# Patient Record
Sex: Male | Born: 1955 | Race: White | Hispanic: No | Marital: Married | Smoking: Former smoker
Health system: Southern US, Community
[De-identification: ages and names within clinical notes are randomized; demographics above are authoritative.]

## PROBLEM LIST (undated history)

## (undated) DIAGNOSIS — T8859XA Other complications of anesthesia, initial encounter: Secondary | ICD-10-CM

## (undated) DIAGNOSIS — J45909 Unspecified asthma, uncomplicated: Secondary | ICD-10-CM

## (undated) DIAGNOSIS — M1990 Unspecified osteoarthritis, unspecified site: Secondary | ICD-10-CM

## (undated) DIAGNOSIS — T4145XA Adverse effect of unspecified anesthetic, initial encounter: Secondary | ICD-10-CM

## (undated) DIAGNOSIS — C801 Malignant (primary) neoplasm, unspecified: Secondary | ICD-10-CM

## (undated) DIAGNOSIS — I619 Nontraumatic intracerebral hemorrhage, unspecified: Secondary | ICD-10-CM

## (undated) DIAGNOSIS — M4800 Spinal stenosis, site unspecified: Secondary | ICD-10-CM

## (undated) DIAGNOSIS — I219 Acute myocardial infarction, unspecified: Secondary | ICD-10-CM

## (undated) HISTORY — PX: LEG SURGERY: SHX1003

## (undated) HISTORY — PX: BACK SURGERY: SHX140

## (undated) HISTORY — PX: SPINE SURGERY: SHX786

## (undated) HISTORY — PX: FINGER SURGERY: SHX640

---

## 1998-05-15 ENCOUNTER — Emergency Department (HOSPITAL_COMMUNITY): Admission: EM | Admit: 1998-05-15 | Discharge: 1998-05-15 | Payer: Self-pay | Admitting: Emergency Medicine

## 1998-05-15 ENCOUNTER — Encounter: Payer: Self-pay | Admitting: Emergency Medicine

## 1998-05-19 ENCOUNTER — Encounter: Payer: Self-pay | Admitting: Emergency Medicine

## 1998-05-19 ENCOUNTER — Emergency Department (HOSPITAL_COMMUNITY): Admission: EM | Admit: 1998-05-19 | Discharge: 1998-05-19 | Payer: Self-pay | Admitting: Emergency Medicine

## 1998-12-16 ENCOUNTER — Emergency Department (HOSPITAL_COMMUNITY): Admission: EM | Admit: 1998-12-16 | Discharge: 1998-12-16 | Payer: Self-pay | Admitting: Emergency Medicine

## 1998-12-16 ENCOUNTER — Encounter: Payer: Self-pay | Admitting: Emergency Medicine

## 1998-12-17 ENCOUNTER — Encounter: Payer: Self-pay | Admitting: Emergency Medicine

## 1998-12-17 ENCOUNTER — Emergency Department (HOSPITAL_COMMUNITY): Admission: EM | Admit: 1998-12-17 | Discharge: 1998-12-17 | Payer: Self-pay | Admitting: Emergency Medicine

## 1998-12-18 ENCOUNTER — Emergency Department (HOSPITAL_COMMUNITY): Admission: EM | Admit: 1998-12-18 | Discharge: 1998-12-18 | Payer: Self-pay | Admitting: Emergency Medicine

## 1998-12-24 ENCOUNTER — Emergency Department (HOSPITAL_COMMUNITY): Admission: EM | Admit: 1998-12-24 | Discharge: 1998-12-24 | Payer: Self-pay | Admitting: Emergency Medicine

## 1998-12-24 ENCOUNTER — Encounter: Payer: Self-pay | Admitting: Emergency Medicine

## 2003-09-12 ENCOUNTER — Emergency Department (HOSPITAL_COMMUNITY): Admission: EM | Admit: 2003-09-12 | Discharge: 2003-09-13 | Payer: Self-pay | Admitting: Emergency Medicine

## 2004-07-05 ENCOUNTER — Emergency Department (HOSPITAL_COMMUNITY): Admission: EM | Admit: 2004-07-05 | Discharge: 2004-07-05 | Payer: Self-pay | Admitting: Emergency Medicine

## 2005-07-28 ENCOUNTER — Emergency Department (HOSPITAL_COMMUNITY): Admission: EM | Admit: 2005-07-28 | Discharge: 2005-07-28 | Payer: Self-pay | Admitting: Emergency Medicine

## 2005-08-27 ENCOUNTER — Emergency Department (HOSPITAL_COMMUNITY): Admission: EM | Admit: 2005-08-27 | Discharge: 2005-08-27 | Payer: Self-pay | Admitting: Emergency Medicine

## 2006-03-17 ENCOUNTER — Encounter: Admission: RE | Admit: 2006-03-17 | Discharge: 2006-03-17 | Payer: Self-pay | Admitting: Orthopedic Surgery

## 2006-03-21 ENCOUNTER — Ambulatory Visit (HOSPITAL_BASED_OUTPATIENT_CLINIC_OR_DEPARTMENT_OTHER): Admission: RE | Admit: 2006-03-21 | Discharge: 2006-03-21 | Payer: Self-pay | Admitting: Orthopedic Surgery

## 2006-03-21 ENCOUNTER — Encounter (INDEPENDENT_AMBULATORY_CARE_PROVIDER_SITE_OTHER): Payer: Self-pay | Admitting: Specialist

## 2008-05-28 ENCOUNTER — Emergency Department (HOSPITAL_COMMUNITY): Admission: EM | Admit: 2008-05-28 | Discharge: 2008-05-28 | Payer: Self-pay | Admitting: *Deleted

## 2009-07-05 ENCOUNTER — Emergency Department (HOSPITAL_COMMUNITY)
Admission: EM | Admit: 2009-07-05 | Discharge: 2009-07-06 | Payer: Self-pay | Source: Home / Self Care | Admitting: Emergency Medicine

## 2009-11-15 ENCOUNTER — Ambulatory Visit: Payer: Self-pay | Admitting: Cardiology

## 2009-11-15 ENCOUNTER — Observation Stay (HOSPITAL_COMMUNITY)
Admission: EM | Admit: 2009-11-15 | Discharge: 2009-11-16 | Payer: Self-pay | Source: Home / Self Care | Admitting: *Deleted

## 2009-11-19 ENCOUNTER — Telehealth (INDEPENDENT_AMBULATORY_CARE_PROVIDER_SITE_OTHER): Payer: Self-pay | Admitting: *Deleted

## 2009-11-23 ENCOUNTER — Ambulatory Visit: Payer: Self-pay | Admitting: Internal Medicine

## 2009-11-23 ENCOUNTER — Encounter: Payer: Self-pay | Admitting: Internal Medicine

## 2009-11-23 ENCOUNTER — Ambulatory Visit: Payer: Self-pay

## 2009-11-23 ENCOUNTER — Encounter (HOSPITAL_COMMUNITY)
Admission: RE | Admit: 2009-11-23 | Discharge: 2010-01-13 | Payer: Self-pay | Source: Home / Self Care | Admitting: Cardiology

## 2010-01-29 ENCOUNTER — Emergency Department (HOSPITAL_COMMUNITY)
Admission: EM | Admit: 2010-01-29 | Discharge: 2010-01-29 | Payer: Self-pay | Source: Home / Self Care | Admitting: Family Medicine

## 2010-05-20 NOTE — Assessment & Plan Note (Signed)
Summary: Cardiology Nuclear Testing  Nuclear Med Background Indications for Stress Test: Evaluation for Ischemia, Post Hospital  Indications Comments: 11/16/09 chest pain and "syncope", MI r/o  History: COPD, Myocardial Infarction  History Comments:  ~'95 MI x3 per patient (no records)  Symptoms: Chest Pressure, Chest Pressure with Exertion, Diaphoresis, DOE, Fatigue, Light-Headedness, Palpitations, Rapid HR, SOB, Syncope  Symptoms Comments: Stress and anxiety. Last episode of ZO:XWRU since d/c.   Nuclear Pre-Procedure Cardiac Risk Factors: Family History - CAD, Smoker Caffeine/Decaff Intake: None NPO After: 6:00 PM Lungs: Clear.  O2 Sat 98% on RA. IV 0.9% NS with Angio Cath: 18g     IV Site: (R) AC IV Started by: Stanton Kidney EMT-P Chest Size (in) 42     Height (in): 70.5 Weight (lb): 176 BMI: 24.99 Tech Comments: Pt. denied being on Lopressor.  Nuclear Med Study 1 or 2 day study:  1 day     Stress Test Type:  Stress Reading MD:  Dietrich Pates, MD     Referring MD:  Olga Millers, MD Resting Radionuclide:  Technetium 54m Tetrofosmin     Resting Radionuclide Dose:  10.6 mCi  Stress Radionuclide:  Technetium 1m Tetrofosmin     Stress Radionuclide Dose:  33.0 mCi   Stress Protocol Exercise Time (min):  10:16 min     Max HR:  146 bpm     Predicted Max HR:  167 bpm  Max Systolic BP: 157 mm Hg     Percent Max HR:  87.43 %     METS: 11.9 Rate Pressure Product:  04540    Stress Test Technologist:  Rea College CMA-N     Nuclear Technologist:  Burna Mortimer Deal RT-N  Rest Procedure  Myocardial perfusion imaging was performed at rest 45 minutes following the intravenous administration of Myoview Technetium 10m Tetrofosmin.  Stress Procedure  The patient exercised for 10:16.  The patient stopped due to fatigue and denied any chest pain.  There were no significant ST-T wave changes, only rare PAC's.  O2 Sats remained 98% throughout exercise.  Myoview was injected at peak exercise and  myocardial perfusion imaging was performed after a brief delay.  QPS Raw Data Images:  Images were motion corrected.  SOFt tissue (diaphragm, bowel activity) underlie heart. Stress Images:  Normal perfusion with minimal apical thinning. Rest Images:  NO significant change from the stress images. Subtraction (SDS):  No evidence of ischemia. Transient Ischemic Dilatation:  .96  (Normal <1.22)  Lung/Heart Ratio:  .34  (Normal <0.45)  Quantitative Gated Spect Images QGS EDV:  122 ml QGS ESV:  51 ml QGS EF:  58 %   Overall Impression  Exercise Capacity: Excellent exercise capacity. BP Response: Normal blood pressure response. Clinical Symptoms: No chest pain ECG Impression: No significant ST segment change suggestive of ischemia. Overall Impression: Normal stress nuclear study.  Appended Document: Cardiology Nuclear Testing ok  Appended Document: Cardiology Nuclear Testing Left message to call back    Appended Document: Cardiology Nuclear Testing pt aware of results

## 2010-05-20 NOTE — Progress Notes (Signed)
Summary: Nuclear Pre-Procedure  Phone Note Outgoing Call   Call placed by: Milana Na, EMT-P,  November 19, 2009 2:50 PM Summary of Call: Reviewed information on Myoview Information Sheet (see scanned document for further details).  Spoke with patient.     Nuclear Med Background Indications for Stress Test: Evaluation for Ischemia, Post Hospital  Indications Comments: CP R/O MI  History: COPD, Myocardial Infarction  History Comments: MI x3  Symptoms: Chest Pain, Chest Tightness, Diaphoresis, DOE, Light-Headedness    Nuclear Pre-Procedure Cardiac Risk Factors: Smoker  Nuclear Med Study Referring MD:  B.Crenshaw

## 2010-06-23 ENCOUNTER — Telehealth (INDEPENDENT_AMBULATORY_CARE_PROVIDER_SITE_OTHER): Payer: Self-pay | Admitting: *Deleted

## 2010-06-29 NOTE — Progress Notes (Signed)
Summary: Records Request  Faxed Stress to [facility] at [facility] HIM Department ([phone]). Debby Freiberg  June 23, 2010 11:31 AM

## 2010-07-01 LAB — WOUND CULTURE: Gram Stain: NONE SEEN

## 2010-07-02 LAB — CARDIAC PANEL(CRET KIN+CKTOT+MB+TROPI)
CK, MB: 6.9 ng/mL (ref 0.3–4.0)
Relative Index: 1.9 (ref 0.0–2.5)
Troponin I: 0.01 ng/mL (ref 0.00–0.06)
Troponin I: <0.01 ng/mL (ref 0.00–0.06)

## 2010-07-03 LAB — URINALYSIS, ROUTINE W REFLEX MICROSCOPIC
Nitrite: NEGATIVE
Protein, ur: NEGATIVE mg/dL
Specific Gravity, Urine: 1.023 (ref 1.005–1.030)
Urobilinogen, UA: 1 mg/dL (ref 0.0–1.0)

## 2010-07-03 LAB — POCT CARDIAC MARKERS
CKMB, poc: 3.8 ng/mL (ref 1.0–8.0)
Myoglobin, poc: 155 ng/mL (ref 12–200)
Troponin i, poc: <0.05 ng/mL (ref 0.00–0.09)

## 2010-07-03 LAB — COMPREHENSIVE METABOLIC PANEL
ALT: 23 U/L (ref 0–53)
AST: 36 U/L (ref 0–37)
Albumin: 3.8 g/dL (ref 3.5–5.2)
Alkaline Phosphatase: 38 U/L — ABNORMAL LOW (ref 39–117)
BUN: 8 mg/dL (ref 6–23)
Chloride: 110 mEq/L (ref 96–112)
Creatinine, Ser: 1.02 mg/dL (ref 0.4–1.5)
Potassium: 3.4 mEq/L — ABNORMAL LOW (ref 3.5–5.1)
Total Protein: 6.7 g/dL (ref 6.0–8.3)

## 2010-07-03 LAB — CK TOTAL AND CKMB (NOT AT ARMC)
CK, MB: 8 ng/mL (ref 0.3–4.0)
Relative Index: 1.9 (ref 0.0–2.5)
Relative Index: 1.9 (ref 0.0–2.5)
Total CK: 426 U/L — ABNORMAL HIGH (ref 7–232)
Total CK: 504 U/L — ABNORMAL HIGH (ref 7–232)

## 2010-07-03 LAB — LIPASE, BLOOD: Lipase: 18 U/L (ref 11–59)

## 2010-07-03 LAB — CBC
MCH: 32 pg (ref 26.0–34.0)
MCV: 92.3 fL (ref 78.0–100.0)
Platelets: 237 10*3/uL (ref 150–400)
RDW: 13.8 % (ref 11.5–15.5)
WBC: 7.4 10*3/uL (ref 4.0–10.5)

## 2010-07-03 LAB — RAPID URINE DRUG SCREEN, HOSP PERFORMED
Amphetamines: NOT DETECTED
Barbiturates: NOT DETECTED
Cocaine: NOT DETECTED

## 2010-07-03 LAB — TROPONIN I: Troponin I: 0.01 ng/mL (ref 0.00–0.06)

## 2010-07-03 LAB — DIFFERENTIAL: Monocytes Absolute: 0.7 10*3/uL (ref 0.1–1.0)

## 2010-07-11 LAB — DIFFERENTIAL
Basophils Absolute: 0.1 10*3/uL (ref 0.0–0.1)
Basophils Relative: 1 % (ref 0–1)
Eosinophils Absolute: 0.3 K/uL (ref 0.0–0.7)
Eosinophils Relative: 3 % (ref 0–5)
Lymphocytes Relative: 21 % (ref 12–46)
Lymphs Abs: 1.9 K/uL (ref 0.7–4.0)
Monocytes Absolute: 0.9 10*3/uL (ref 0.1–1.0)
Monocytes Relative: 10 % (ref 3–12)
Neutro Abs: 5.9 K/uL (ref 1.7–7.7)
Neutrophils Relative %: 65 % (ref 43–77)

## 2010-07-11 LAB — PROTIME-INR
INR: 1.09 (ref 0.00–1.49)
Prothrombin Time: 14 s (ref 11.6–15.2)

## 2010-07-11 LAB — CBC
HCT: 44.7 % (ref 39.0–52.0)
Hemoglobin: 15.3 g/dL (ref 13.0–17.0)
MCHC: 34.3 g/dL (ref 30.0–36.0)
MCV: 89.7 fL (ref 78.0–100.0)
Platelets: 230 K/uL (ref 150–400)
RBC: 4.98 MIL/uL (ref 4.22–5.81)
RDW: 13.9 % (ref 11.5–15.5)
WBC: 9.1 10*3/uL (ref 4.0–10.5)

## 2010-07-11 LAB — COMPREHENSIVE METABOLIC PANEL
AST: 17 U/L (ref 0–37)
Albumin: 3.4 g/dL — ABNORMAL LOW (ref 3.5–5.2)
Alkaline Phosphatase: 48 U/L (ref 39–117)
BUN: 16 mg/dL (ref 6–23)
Chloride: 107 mEq/L (ref 96–112)
GFR calc Af Amer: >60 mL/min (ref >60)
Glucose, Bld: 108 mg/dL — ABNORMAL HIGH (ref 70–99)
Total Protein: 7.1 g/dL (ref 6.0–8.3)

## 2010-07-11 LAB — LIPASE, BLOOD: Lipase: 14 U/L (ref 11–59)

## 2010-07-11 LAB — COMPREHENSIVE METABOLIC PANEL WITH GFR
ALT: 12 U/L (ref 0–53)
CO2: 23 meq/L (ref 19–32)
Calcium: 8.9 mg/dL (ref 8.4–10.5)
Creatinine, Ser: 0.94 mg/dL (ref 0.4–1.5)
GFR calc non Af Amer: >60 mL/min (ref >60)
Potassium: 3.8 meq/L (ref 3.5–5.1)
Sodium: 136 meq/L (ref 135–145)
Total Bilirubin: 1.1 mg/dL (ref 0.3–1.2)

## 2010-07-11 LAB — TYPE AND SCREEN
ABO/RH(D): O POS
Antibody Screen: NEGATIVE

## 2010-07-11 LAB — APTT: aPTT: 38 seconds — ABNORMAL HIGH (ref 24–37)

## 2010-09-03 NOTE — Op Note (Signed)
NAMEQUINTO, Dominic Fields               ACCOUNT NO.:  [id number]   MEDICAL RECORD NO.:  [id number]          PATIENT TYPE:  AMB   LOCATION:  DSC                          FACILITY:  [facility]   PHYSICIAN:  Katy Fitch. Sypher, M.D. DATE OF BIRTH:  1955/12/04   DATE OF PROCEDURE:  03/21/2006  DATE OF DISCHARGE:                               OPERATIVE REPORT   PREOPERATIVE DIAGNOSES:  1. 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  2. Chronic left carpal tunnel syndrome.   POSTOPERATIVE DIAGNOSES:  1. 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  2. Chronic left carpal tunnel syndrome.   OPERATION:  1. Left wrist proximal row carpectomy.  2. Posterior interosseous neurectomy, left distal dorsal forearm.  3. Capsular invagination to stabilize radiocapitate articulation.  4. Right carpal tunnel release.   OPERATING SURGEON:  Josephine Igo, M.D.   ASSISTANT:  Annye Rusk, PA-C.   ANESTHESIA:  Is general by LMA; supervising anesthesiologist is Dr.  Jairo Ben.   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  He has a  history of sustaining a remote injury to his left wrist with the  development of chronic left wrist pain.  He also has had progressive  numbness of his left hand in the median innervated fingers.   He brought these symptoms to the attention of his primary care  physician, Dr. Theresia Lo, at [facility] at [facility].  Dr. Theresia Lo referred him to [facility] for  consult.  He was advised to consider a number of procedures on his left  wrist but was not comfortable proceeding at that time.  He ultimately  elected to seek an alternative orthopedic opinion at the [facility]  [place].  Clinical examination on February 24, 2006 revealed evidence  of chronic scapholunate advanced collapse  predicament with radiocarpal  arthrosis.  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   I had a lengthy discussion with Dominic Fields at that time outlining  treatment choices including a proximal row carpectomy verses wrist  fusion.  Given the fact there was slight narrowing of his midcarpal  joint, we discussed the possibility of an osteochondral graft versus  microfracture of the surface of the capitate versus complete wrist  fusion.   Given the predicament of possibly needing a second surgery for plate  removal and given the fact that he is only 55 years of age, we  ultimately decided to attempt to a proximal row carpectomy.   In addition, he was noted to have numbness in his fingers and had  electrodiagnostic studies completed by Dr. Johna Roles.  These were  remarkable for evidence of bilateral carpal tunnel syndrome.   After informed consent, Dominic Fields was brought to the operating room at  this time anticipating a left wrist proximal row carpectomy, a posterior  interosseous neurectomy for pain control, stabilization of his capsular  ligaments as necessary, and a carpal  tunnel release.   After informed consent, he was brought to the operating room.  Preoperatively, he was seen by Dr. Jean Rosenthal, who performed a consultation  and recommended general anesthesia by LMA technique.   PROCEDURE:  Leslie Langille was brought to operating room and placed in  supine position on the table.   Following the induction of general anesthesia by LMA technique, the left  arm was prepped with Betadine soap solution and sterilely draped.  One  gram of Ancef had been administered 1 hour prior to surgery in the  holding area as IV prophylactic antibiotic.   The procedure commenced with a 4-cm incision extending from the radial  aspect of the fourth dorsal compartment distally to the long finger  metacarpal base.  The subcutaneous  tissues were carefully divided taking  care to release the extensor retinaculum and electrocauterized  transverse veins.  The extensor retinaculum was split longitudinally,  and the tendons of the fourth dorsal compartment were retracted in an  ulnar direction.  The posterior interosseous nerve and vessel were  identified.  The vessel was cauterized,and a 1-cm segment of the nerve  resected over the dorsum of the radius.  The capsule was open in a T-  shaped manner, and the midcarpal joint and radiolunate articulation were  carefully inspected.  There was normal hyaline articular cartilage on  the proximal capitate except for a small area dorsally.  This was in a  nonarticular position except for extreme wrist dorsiflexion.  Given  these circumstances, we contemplated a possible OATS procedure; however,  after discussing the merits of an OATS procedure versus simple  microfracture given the very small area of chondromalacia in my judgment  microfracture was an appropriate approach as well as use of a capsular  sling to stabilize the radiocapitate articulation postoperatively.   The procedure commenced with circumferential dissection of the scaphoid  and removal of the entire scaphoid piecemeal with an osteotome, rongeurs  and Personal assistant.  After synovectomy, the lunate was carefully  examined, and with the aid of a towel clip and a sharp periosteal  elevator, the lunate was removed as a single piece.  We carefully  inspected the proximal capitate and indeed had adequate bone graft and  hyaline cartilage from the lunate to provide an OATS graft.  However,  again as Dominic Fields is a smoker and had not consented to the OATS  procedure, we elected not to proceed in that direction at this time.   A 0.035-inch Kirschner wires was used to drill 10 holes in the surface  of the dorsal capitate to allow fibrocartilage growth.  The load bearing portion of the capitate had normal-appearing  hyaline cartilage.   The triquetrum was removed as a single unit, once again using a towel  clip and a sharp osteotome and tenotomy scissors.   After complete synovectomy, hemostasis was achieved.  A capsular flap  was created on the ulnar aspect of the capitate with redundant capsule  in a portion of the ulnocarpal ligaments.  This was sewed to a portion  of the radiocarpal ligament dorsally creating a sling, preventing ulnar  translation of the capitate.  The capitate nestled into the lunate  facette well followed by closure of this capsular sling and  reconstruction of the dorsal capsule with multiple figure-of-eight  sutures of 3-0 Ethibond.   The extensor tendons were replaced in anatomic position followed by  repair the extensor retinaculum with multiple mattress sutures of 3-0  Ethilon with  the knots invaginated.   The skin was then repaired with subdermal suture of 4-0 Vicryl and  intradermal 3-0 Prolene.   Attention was directed to the palm.  A traditional carpal tunnel  incision was fashioned in the line of the ring finger.  Subcutaneous  tissues were carefully divided revealing the palmar fascia.  This was  split longitudinally and revealed the ___________ branch of the median  nerve.  The median nerve and flexor tendons were identified followed by  creation of a pathway with a Penfield 4 elevator along the ulnar aspect  of the deep surface of the transverse carpal ligament.  Scissors were  used to release the transverse carpal ligament subcutaneously into the  distal forearm.   This palmar wound was then repaired with intradermal 3-0 Prolene.  A  compressive dressing was applied to the hand followed by a voluminous  dressing of sterile gauze in the form of Kerlix, sterile Webril and a  sugar-tong splint.  The form was placed in 20 degrees of supination.   There were no apparent complications.   C-arm images following proximal row carpectomy revealed very   satisfactory resection of the proximal carpal row with only minimal  fragments of the scaphoid left distally to maintain the integrity of the  volar ligament structures.   The ulnar styloid fragment was left in position.   There were no apparent complications.   Dominic Fields tolerated the surgery and anesthesia well.  He was  transferred to the recovery room with stable signs.  He will be admitted  to the recovery care center for observation vital signs and antibiotics  in the form of IV Ancef 1 g q.8h. x24 hours and appropriate analgesics  in the form of IV and p.o. Dilaudid and IV PCA morphine p.r.n.      Katy Fitch Sypher, M.D.  Electronically Signed     RVS/MEDQ  D:  03/21/2006  T:  03/22/2006  Job:  [id number]

## 2012-09-03 ENCOUNTER — Ambulatory Visit: Payer: Medicare Other | Attending: Internal Medicine | Admitting: Internal Medicine

## 2012-09-03 ENCOUNTER — Encounter: Payer: Self-pay | Admitting: Internal Medicine

## 2012-09-03 VITALS — BP 116/80 | HR 62 | Temp 98.4°F | Resp 20 | Ht 68.5 in | Wt 188.0 lb

## 2012-09-03 DIAGNOSIS — M549 Dorsalgia, unspecified: Secondary | ICD-10-CM | POA: Insufficient documentation

## 2012-09-03 DIAGNOSIS — J45909 Unspecified asthma, uncomplicated: Secondary | ICD-10-CM | POA: Insufficient documentation

## 2012-09-03 DIAGNOSIS — R413 Other amnesia: Secondary | ICD-10-CM

## 2012-09-03 DIAGNOSIS — G8929 Other chronic pain: Secondary | ICD-10-CM | POA: Insufficient documentation

## 2012-09-03 MED ORDER — NAPROXEN 500 MG PO TABS: 500.0000 mg | ORAL_TABLET | Freq: Two times a day (BID) | ORAL | Status: DC

## 2012-09-03 MED ORDER — TRAMADOL HCL 50 MG PO TABS: 50.0000 mg | ORAL_TABLET | Freq: Four times a day (QID) | ORAL | Status: DC | PRN
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
## 2012-10-26 ENCOUNTER — Ambulatory Visit: Payer: Medicare Other | Attending: Family Medicine

## 2012-10-26 DIAGNOSIS — J45909 Unspecified asthma, uncomplicated: Secondary | ICD-10-CM

## 2012-10-26 DIAGNOSIS — R413 Other amnesia: Secondary | ICD-10-CM

## 2012-10-26 DIAGNOSIS — M549 Dorsalgia, unspecified: Secondary | ICD-10-CM

## 2012-10-26 LAB — BASIC METABOLIC PANEL
BUN: 19 mg/dL (ref 6–23)
CO2: 26 mEq/L (ref 19–32)
Chloride: 105 mEq/L (ref 96–112)
Glucose, Bld: 91 mg/dL (ref 70–99)
Potassium: 4.5 mEq/L (ref 3.5–5.3)
Sodium: 139 mEq/L (ref 135–145)

## 2012-10-26 LAB — CBC WITH DIFFERENTIAL/PLATELET
Basophils Absolute: 0.1 10*3/uL (ref 0.0–0.1)
Eosinophils Absolute: 0.4 10*3/uL (ref 0.0–0.7)
Eosinophils Relative: 7 % — ABNORMAL HIGH (ref 0–5)
HCT: 41.8 % (ref 39.0–52.0)
Lymphocytes Relative: 32 % (ref 12–46)
MCH: 29.7 pg (ref 26.0–34.0)
MCV: 83.9 fL (ref 78.0–100.0)
Monocytes Absolute: 0.5 10*3/uL (ref 0.1–1.0)
Platelets: 268 10*3/uL (ref 150–400)
RDW: 13.8 % (ref 11.5–15.5)
WBC: 5.8 10*3/uL (ref 4.0–10.5)

## 2012-11-02 ENCOUNTER — Ambulatory Visit: Payer: Medicare Other | Attending: Family Medicine | Admitting: Family Medicine

## 2012-11-02 ENCOUNTER — Telehealth: Payer: Self-pay | Admitting: Family Medicine

## 2012-11-02 ENCOUNTER — Encounter: Payer: Self-pay | Admitting: Family Medicine

## 2012-11-02 VITALS — BP 143/84 | HR 66 | Temp 98.0°F | Ht 70.5 in | Wt 186.8 lb

## 2012-11-02 DIAGNOSIS — J4520 Mild intermittent asthma, uncomplicated: Secondary | ICD-10-CM

## 2012-11-02 DIAGNOSIS — M549 Dorsalgia, unspecified: Secondary | ICD-10-CM

## 2012-11-02 DIAGNOSIS — G8929 Other chronic pain: Secondary | ICD-10-CM

## 2012-11-02 DIAGNOSIS — J45909 Unspecified asthma, uncomplicated: Secondary | ICD-10-CM

## 2012-11-02 NOTE — Progress Notes (Signed)
Patient ID: Dominic Fields, male   DOB: 23-Feb-1956, 57 y.o.   MRN: [id number]  CC: follow up chronic back pain  HPI: Pt is presenting to center for follow up of chronic back pain.  He has been on naproxen and tramadol and reporting tht he is still having significant pain.   Allergies  Allergen Reactions  . Hydrocodone-Acetaminophen    Past Medical History  Diagnosis Date  . Asthma, chronic 09/03/2012   Current Outpatient Prescriptions on File Prior to Visit  Medication Sig Dispense Refill  . ibuprofen (ADVIL,MOTRIN) 800 MG tablet Take 800 mg by mouth every 8 (eight) hours as needed for pain.      . naproxen (NAPROSYN) 500 MG tablet Take 1 tablet (500 mg total) by mouth 2 (two) times daily with a meal.  60 tablet  3  . traMADol (ULTRAM) 50 MG tablet Take 1 tablet (50 mg total) by mouth every 6 (six) hours as needed for pain.  60 tablet  3   No current facility-administered medications on file prior to visit.   No family history on file. History   Social History  . Marital Status: Married    Spouse Name: N/A    Number of Children: N/A  . Years of Education: N/A   Occupational History  . Not on file.   Social History Main Topics  . Smoking status: Light Tobacco Smoker    Types: Cigarettes  . Smokeless tobacco: Not on file  . Alcohol Use: Yes  . Drug Use: No  . Sexually Active: No   Other Topics Concern  . Not on file   Social History Narrative  . No narrative on file    Review of Systems  Constitutional: Negative for fever, chills, diaphoresis, activity change, appetite change and fatigue.  HENT: Negative for ear pain, nosebleeds, congestion, facial swelling, rhinorrhea, neck pain, neck stiffness and ear discharge.   Eyes: Negative for pain, discharge, redness, itching and visual disturbance.  Respiratory: Negative for cough, choking, chest tightness, shortness of breath, wheezing and stridor.   Cardiovascular: Negative for chest pain, palpitations and leg swelling.   Gastrointestinal: Negative for abdominal distention.  Genitourinary: Negative for dysuria, urgency, frequency, hematuria, flank pain, decreased urine volume, difficulty urinating and dyspareunia.  Musculoskeletal: Negative for back pain, joint swelling, arthralgias and gait problem.  Neurological: Negative for dizziness, tremors, seizures, syncope, facial asymmetry, speech difficulty, weakness, light-headedness, numbness and headaches.  Hematological: Negative for adenopathy. Does not bruise/bleed easily.  Psychiatric/Behavioral: Negative for hallucinations, behavioral problems, confusion, dysphoric mood, decreased concentration and agitation.    Objective:   Filed Vitals:   11/02/12 0908  BP: 143/84  Pulse: 66  Temp: 98 F (36.7 C)    Physical Exam  Constitutional: Appears well-developed and well-nourished. No distress.  HENT: Normocephalic. External right and left ear normal. Oropharynx is clear and moist.  Eyes: Conjunctivae and EOM are normal. PERRLA, no scleral icterus.  Neck: Normal ROM. Neck supple. No JVD. No tracheal deviation. No thyromegaly.  CVS: RRR, S1/S2 +, no murmurs, no gallops, no carotid bruit.  Pulmonary: Effort and breath sounds normal, no stridor, rhonchi, wheezes, rales.  Abdominal: Soft. BS +,  no distension, tenderness, rebound or guarding.  Musculoskeletal: large scar in lumbar spine and scarred down in lumbar spine with paraspinal muscle spasm.  Lymphadenopathy: No lymphadenopathy noted, cervical, inguinal. Neuro: Alert. Normal reflexes, muscle tone coordination. No cranial nerve deficit. Skin: Skin is warm and dry. No rash noted. Not diaphoretic. No erythema. No pallor.  Psychiatric: Normal mood and affect. Behavior, judgment, thought content normal.   Lab Results  Component Value Date   WBC 5.8 10/26/2012   HGB 14.8 10/26/2012   HCT 41.8 10/26/2012   MCV 83.9 10/26/2012   PLT 268 10/26/2012   Lab Results  Component Value Date   CREATININE 1.28  10/26/2012   BUN 19 10/26/2012   NA 139 10/26/2012   K 4.5 10/26/2012   CL 105 10/26/2012   CO2 26 10/26/2012    No results found for this basename: HGBA1C   Lipid Panel  No results found for this basename: chol, trig, hdl, cholhdl, vldl, ldlcalc       Assessment and plan:   Patient Active Problem List   Diagnosis Date Noted  . Chronic back pain 09/03/2012  . Short-term memory loss 09/03/2012  . Asthma, chronic 09/03/2012   Refer to pain management   Refer to behavioral health for evaluation of PTSD symptoms  The patient was counseled on the dangers of tobacco use, and was advised to quit.  Reviewed strategies to maximize success, including removing cigarettes and smoking materials from environment.   The patient was given clear instructions to go to ER or return to medical center if symptoms don't improve, worsen or new problems develop.  The patient verbalized understanding.  The patient was told to call to get any lab results if not heard anything in the next week.    Rodney Langton, MD, CDE, FAAFP [facility] [place], [place]

## 2012-11-02 NOTE — Telephone Encounter (Signed)
Pt asking about pain clinic referral, looks like he is ready to schedule. Please call back.

## 2012-11-02 NOTE — Patient Instructions (Addendum)
Chronic Pain Management Managing chronic pain is not easy. The goal is to provide as much pain relief as possible. There are emotional as well as physical problems. Chronic pain may lead to symptoms of depression which magnify those of the pain. Problems may include:  Anxiety.  Sleep disturbances.  Confused thinking.  Feeling cranky.  Fatigue.  Weight gain or loss. Identify the source of the pain first, if possible. The pain may be masking another problem. Try to find a pain management specialist or clinic. Work with a team to create a treatment plan for you. MEDICATIONS  May include narcotics or opioids. Larger than normal doses may be needed to control your pain.  Drugs for depression may help.  Over-the-counter medicines may help for some conditions. These drugs may be used along with others for better pain relief.  May be injected into sites such as the spine and joints. Injections may have to be repeated if they wear off. THERAPY MAY INCLUDE:  Working with a physical therapist to keep from getting stiff.  Regular, gentle exercise.  Cognitive or behavioral therapy.  Using complementary or integrative medicine such as:  Acupuncture.  Massage, Reiki, or Rolfing.  Aroma, color, light, or sound therapy.  Group support. FOR MORE INFORMATION [email or web address]. American Chronic Pain Association [email or web address]. Document Released: 05/12/2004 Document Revised: 06/27/2011 Document Reviewed: 06/21/2007 [facility] Patient Information 2014 [place], [place]. Chronic Back Pain  When back pain lasts longer than 3 months, it is called chronic back pain.People with chronic back pain often go through certain periods that are more intense (flare-ups).  CAUSES Chronic back pain can be caused by wear and tear (degeneration) on different structures in your back. These structures include:  The bones of your spine (vertebrae) and the joints surrounding your spinal cord  and nerve roots (facets).  The strong, fibrous tissues that connect your vertebrae (ligaments). Degeneration of these structures may result in pressure on your nerves. This can lead to constant pain. HOME CARE INSTRUCTIONS  Avoid bending, heavy lifting, prolonged sitting, and activities which make the problem worse.  Take brief periods of rest throughout the day to reduce your pain. Lying down or standing usually is better than sitting while you are resting.  Take over-the-counter or prescription medicines only as directed by your caregiver. SEEK IMMEDIATE MEDICAL CARE IF:   You have weakness or numbness in one of your legs or feet.  You have trouble controlling your bladder or bowels.  You have nausea, vomiting, abdominal pain, shortness of breath, or fainting. Document Released: 05/12/2004 Document Revised: 06/27/2011 Document Reviewed: 03/19/2011 [facility] Patient Information 2014 [place], [place]. Chronic Pain Chronic pain can be defined as pain that is lasting, off and on, and lasts for 3 to 6 months or longer. Many things cause chronic pain, which can make it difficult to make a discrete diagnosis. There are many treatment options available for chronic pain. However, finding a treatment that works well for you may require trying various approaches until a suitable one is found. CAUSES  In some types of chronic medical conditions, the pain is caused by a normal pain response within the body. A normal pain response helps the body identify illness or injury and prevent further damage from being done. In these cases, the cause of the pain may be identified and treated, even if it may not be cured completely. Examples of chronic conditions which can cause chronic pain include:  Inflammation of the joints (arthritis).  Back pain or neck pain (including bulging or  herniated disks).  Migraine headaches.  Cancer. In some other types of chronic pain syndromes, the pain is caused by an  abnormal pain response within the body. An abnormal pain response is present when there is no ongoing cause (or stimulus) for the pain, or when the cause of the pain is arising from the nerves or nervous system itself. Examples of conditions which can cause chronic pain due to an abnormal pain response include:  Fibromyalgia.  Reflex sympathetic dystrophy (RSD).  Neuropathy (when the nerves themselves are damaged, and may cause pain). DIAGNOSIS  Your caregiver will help diagnose your condition over time. In many cases, the initial focus will be on excluding conditions that could be causing the pain. Depending on your symptoms, your caregiver may order some tests to diagnose your condition. Some of these tests include:  Blood tests.  Computerized X-ray scans (CT scan).  Computerized magnetic scans (MRI).  X-rays.  Ultrasounds.  Nerve conduction studies.  Consultation with other physicians or specialists. TREATMENT  There are many treatment options for people suffering from chronic pain. Finding a treatment that works well may take time.   You may be referred to a pain management specialist.  You may be put on medication to help with the pain. Unfortunately, some medications (such as opiate medications) may not be very effective in cases where chronic pain is due to abnormal pain responses. Finding the right medications can take some time.  Adjunctive therapies may be used to provide additional relief and improve a patient's quality of life. These therapies include:  Mindfulness meditation.  Acupuncture.  Biofeedback.  Cognitive-behavioral therapy.  In certain cases, surgical interventions may be attempted. HOME CARE INSTRUCTIONS   Make sure you understand these instructions prior to discharge.  Ask any questions and share any further concerns you have with your caregiver prior to discharge.  Take all medications as directed by your caregiver.  Keep all follow-up  appointments. SEEK MEDICAL CARE IF:   Your pain gets worse.  You develop a new pain that was not present before.  You cannot tolerate any medications prescribed by your caregiver.  You develop new symptoms since your last visit with your caregiver. SEEK IMMEDIATE MEDICAL CARE IF:   You develop muscular weakness.  You have decreased sensation or numbness.  You lose control of bowel or bladder function.  Your pain suddenly gets much worse.  You have an oral temperature above 102 F (38.9 C), not controlled by medication.  You develop shaking chills, confusion, chest pain, or shortness of breath. Document Released: 12/25/2001 Document Revised: 06/27/2011 Document Reviewed: 04/02/2008 [facility] Patient Information 2014 [place], [place].  Post-Traumatic Stress Disorder If you have been diagnosed with post-traumatic stress disorder (PTSD), you have probably experienced a traumatic event in your life. These events are usually outside of the range of normal human experience and would negatively impact any normal person.  CAUSES  A person can get PTSD after living through or seeing a dangerous event such as:  An automobile accident.  War.  Natural disaster.  Rape.  Domestic violence.  Any event where there has been a threat to life. PTSD is a real illness. PTSD Can happen to anyone at any age. Children get PTSD too. A doctor, or mental health professional with experience in treating PTSD can help you. SYMPTOMS  Not all symptoms may be present in any one person.  Distressing dreams.  Flashback: feeling the frightening event is happening again.  Avoiding activities, places, and people that remind  you of the event.  Avoiding thoughts and feelings associated with the event.  Having frightening thoughts you cannot control.  Feeling on the edge with increased alertness and vigilance.  Trouble sleeping.  Feeling alone, detached from others.  Angry outbursts.  Feeling  worried, guilty, or sad.  Having thoughts of hurting yourself or others. PTSD may start soon after a frightening event or months or years later. Many war veterans have PTSD. Drinking alcohol or using drugs will not help PTSD and may even make it worse.  TREATMENT  PTSD can be treated. Treatment may include "talk" therapy, medicine, or both. Either a doctor or a mental health professional who is experienced in treating PTSD can help you. Early diagnosis and treatment is best and can show more rapid improvement. Get help if you or a loved one are thinking of hurting yourself. Call your local emergency medical services if you need help immediately. Document Released: 12/28/2000 Document Revised: 06/27/2011 Document Reviewed: 12/12/2007 [facility] Patient Information 2014 [place], [place].

## 2012-11-02 NOTE — Telephone Encounter (Signed)
P t was here today 11/02/12 and Dr Laural Benes did the referrral today .  I called Mr Lindley and I told him that his referral is going to be sent today and the doctor need to review so they decide if they can't help him or not . He didn't like  The process and the waiting time.

## 2012-11-13 ENCOUNTER — Encounter: Payer: Self-pay | Admitting: Physical Medicine & Rehabilitation

## 2012-12-14 ENCOUNTER — Encounter: Payer: Medicare Other | Attending: Physical Medicine & Rehabilitation

## 2012-12-14 ENCOUNTER — Ambulatory Visit (HOSPITAL_BASED_OUTPATIENT_CLINIC_OR_DEPARTMENT_OTHER): Payer: Medicare Other | Admitting: Physical Medicine & Rehabilitation

## 2012-12-14 ENCOUNTER — Encounter: Payer: Self-pay | Admitting: Physical Medicine & Rehabilitation

## 2012-12-14 VITALS — BP 125/75 | HR 60 | Resp 14 | Ht 71.0 in | Wt 194.0 lb

## 2012-12-14 DIAGNOSIS — M47817 Spondylosis without myelopathy or radiculopathy, lumbosacral region: Secondary | ICD-10-CM | POA: Insufficient documentation

## 2012-12-14 DIAGNOSIS — M545 Low back pain, unspecified: Secondary | ICD-10-CM | POA: Insufficient documentation

## 2012-12-14 DIAGNOSIS — S069XAS Unspecified intracranial injury with loss of consciousness status unknown, sequela: Secondary | ICD-10-CM | POA: Insufficient documentation

## 2012-12-14 DIAGNOSIS — S06890S Other specified intracranial injury without loss of consciousness, sequela: Secondary | ICD-10-CM

## 2012-12-14 DIAGNOSIS — S069X9S Unspecified intracranial injury with loss of consciousness of unspecified duration, sequela: Secondary | ICD-10-CM

## 2012-12-14 DIAGNOSIS — R279 Unspecified lack of coordination: Secondary | ICD-10-CM | POA: Insufficient documentation

## 2012-12-14 DIAGNOSIS — Z5181 Encounter for therapeutic drug level monitoring: Secondary | ICD-10-CM

## 2012-12-14 DIAGNOSIS — S069X9A Unspecified intracranial injury with loss of consciousness of unspecified duration, initial encounter: Secondary | ICD-10-CM | POA: Insufficient documentation

## 2012-12-14 DIAGNOSIS — M48062 Spinal stenosis, lumbar region with neurogenic claudication: Secondary | ICD-10-CM | POA: Insufficient documentation

## 2012-12-14 DIAGNOSIS — Z79899 Other long term (current) drug therapy: Secondary | ICD-10-CM

## 2012-12-14 DIAGNOSIS — G8929 Other chronic pain: Secondary | ICD-10-CM

## 2012-12-14 DIAGNOSIS — M549 Dorsalgia, unspecified: Secondary | ICD-10-CM

## 2012-12-14 DIAGNOSIS — R413 Other amnesia: Secondary | ICD-10-CM | POA: Insufficient documentation

## 2012-12-14 MED ORDER — NAPROXEN 500 MG PO TABS: 500.0000 mg | ORAL_TABLET | Freq: Two times a day (BID) | ORAL | Status: DC

## 2012-12-14 MED ORDER — TRAMADOL HCL 50 MG PO TABS: 50.0000 mg | ORAL_TABLET | Freq: Three times a day (TID) | ORAL | Status: DC | PRN

## 2012-12-14 NOTE — Patient Instructions (Addendum)
Take naproxen twice a day Tramadol 3 times a day Continue stretching exercises Injections next month
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
## 2013-01-03 ENCOUNTER — Encounter: Payer: Self-pay | Admitting: Physical Medicine & Rehabilitation

## 2013-01-03 ENCOUNTER — Ambulatory Visit (HOSPITAL_BASED_OUTPATIENT_CLINIC_OR_DEPARTMENT_OTHER): Payer: Medicare Other | Admitting: Physical Medicine & Rehabilitation

## 2013-01-03 ENCOUNTER — Encounter: Payer: Medicare Other | Attending: Physical Medicine & Rehabilitation

## 2013-01-03 VITALS — BP 123/69 | HR 70 | Resp 14 | Ht 71.0 in | Wt 199.6 lb

## 2013-01-03 DIAGNOSIS — M48062 Spinal stenosis, lumbar region with neurogenic claudication: Secondary | ICD-10-CM | POA: Insufficient documentation

## 2013-01-03 DIAGNOSIS — M545 Low back pain, unspecified: Secondary | ICD-10-CM | POA: Insufficient documentation

## 2013-01-03 DIAGNOSIS — R413 Other amnesia: Secondary | ICD-10-CM | POA: Insufficient documentation

## 2013-01-03 DIAGNOSIS — M47817 Spondylosis without myelopathy or radiculopathy, lumbosacral region: Secondary | ICD-10-CM | POA: Insufficient documentation

## 2013-01-03 DIAGNOSIS — S069X9S Unspecified intracranial injury with loss of consciousness of unspecified duration, sequela: Secondary | ICD-10-CM | POA: Insufficient documentation

## 2013-01-03 DIAGNOSIS — R279 Unspecified lack of coordination: Secondary | ICD-10-CM | POA: Insufficient documentation

## 2013-01-03 DIAGNOSIS — S069XAS Unspecified intracranial injury with loss of consciousness status unknown, sequela: Secondary | ICD-10-CM | POA: Insufficient documentation

## 2013-01-03 NOTE — Patient Instructions (Signed)
Epidural Steroid Injection  Care After   Refer to this sheet in the next few weeks. These instructions provide you with information on caring for yourself after your procedure. Your caregiver may also give you more specific instructions. Your treatment has been planned according to current medical practices, but problems sometimes occur. Call your caregiver if you have any problems or questions after your procedure.  HOME CARE INSTRUCTIONS   · Avoid the use of heat on the injection site for a day.  · Do not have a tub bath or soak in water for the rest of the day.  · Remove the bandage on the next day.  · Resume your normal activities on the next day.  · Use ice packs or mild pain relievers to reduce the soreness around the injection site.  · Follow up with your caregiver 7 to 10 days after the procedure.  SEEK MEDICAL CARE IF:   · You develop a fever of more than 100.5° F (38.1° C).  · You continue to have pain and soreness over the injection site even after taking medicines.  · You develop significant nausea or vomiting.  SEEK IMMEDIATE MEDICAL CARE IF:   · You have severe back pain, which is not relieved by medicines.  · You develop severe headache, stiff neck, or sensitivity to light.  · You develop any new numbness or weakness of your legs.  · You lose control over your bladder or bowel movements.  · You develop a fever of more than 102° F (38.9° C).  · You develop difficulty breathing.  Document Released: 07/20/2010 Document Revised: 06/27/2011 Document Reviewed: 07/20/2010  Exit[employer]® Patient Information ©2014 [employer].

## 2013-01-03 NOTE — Progress Notes (Signed)
  PROCEDURE RECORD [facility]   Name: Dominic Fields DOB:14-Aug-1955 MRN: [id number]  Date:01/03/2013  Physician: Claudette Laws, MD    Nurse/CMA: Kelli Churn RN/Corda Shutt CMA  Allergies:  Allergies  Allergen Reactions  . Hydrocodone-Acetaminophen     Consent Signed: yes  Is patient diabetic? no  CBG today? no  Pregnant: no LMP: No LMP for male patient. (age 56-55)  Anticoagulants: no Anti-inflammatory: no Antibiotics: no  Procedure: Bilateral S1 Epidural Steroid Injection Position: Prone Start Time: 150 End Time: 204 Fluoro Time: 23 seconds  RN/CMA Shumaker/Keni Wafer Shumaker/Larz Mark    Time 131 207    BP 123/69 140/73    Pulse 70 67    Respirations 14 14    O2 Sat 98 99    S/S 6 6    Pain Level 7/10 7/10     D/C home with Richard(brother), patient A & O X 3, D/C instructions reviewed, and sits independently.

## 2013-04-04 ENCOUNTER — Encounter: Payer: Self-pay | Admitting: Physical Medicine & Rehabilitation

## 2013-04-04 ENCOUNTER — Encounter: Payer: Medicare Other | Attending: Physical Medicine & Rehabilitation

## 2013-04-04 ENCOUNTER — Ambulatory Visit (HOSPITAL_BASED_OUTPATIENT_CLINIC_OR_DEPARTMENT_OTHER): Payer: Medicare Other | Admitting: Physical Medicine & Rehabilitation

## 2013-04-04 VITALS — BP 115/79 | HR 77 | Resp 16 | Ht 71.0 in | Wt 199.0 lb

## 2013-04-04 DIAGNOSIS — M47817 Spondylosis without myelopathy or radiculopathy, lumbosacral region: Secondary | ICD-10-CM | POA: Insufficient documentation

## 2013-04-04 DIAGNOSIS — R413 Other amnesia: Secondary | ICD-10-CM | POA: Insufficient documentation

## 2013-04-04 DIAGNOSIS — M48062 Spinal stenosis, lumbar region with neurogenic claudication: Secondary | ICD-10-CM | POA: Insufficient documentation

## 2013-04-04 DIAGNOSIS — S069XAS Unspecified intracranial injury with loss of consciousness status unknown, sequela: Secondary | ICD-10-CM | POA: Insufficient documentation

## 2013-04-04 DIAGNOSIS — M545 Low back pain, unspecified: Secondary | ICD-10-CM | POA: Insufficient documentation

## 2013-04-04 DIAGNOSIS — S069X9S Unspecified intracranial injury with loss of consciousness of unspecified duration, sequela: Secondary | ICD-10-CM | POA: Insufficient documentation

## 2013-04-04 DIAGNOSIS — IMO0002 Reserved for concepts with insufficient information to code with codable children: Secondary | ICD-10-CM

## 2013-04-04 DIAGNOSIS — M5416 Radiculopathy, lumbar region: Secondary | ICD-10-CM | POA: Insufficient documentation

## 2013-04-04 DIAGNOSIS — R279 Unspecified lack of coordination: Secondary | ICD-10-CM | POA: Insufficient documentation

## 2013-04-04 MED ORDER — TRAMADOL HCL 50 MG PO TABS: 50.0000 mg | ORAL_TABLET | Freq: Three times a day (TID) | ORAL | Status: DC | PRN

## 2013-04-04 NOTE — Patient Instructions (Signed)

## 2013-04-04 NOTE — Progress Notes (Signed)
Bi lateral S1 transforaminal injection Indication for lumbar postlaminectomy syndrome with pain radiating from the back to both feet Lumbar transforaminal epidural steroid injection under fluoroscopic guidance  Indication: Lumbosacral radiculitis is not relieved by medication management or other conservative care and interfering with self-care and mobility.   Informed consent was obtained after describing risk and benefits of the procedure with the patient, this includes bleeding, bruising, infection, paralysis and medication side effects.  The patient wishes to proceed and has given written consent.  Patient was placed in prone position.  The lumbar area was marked and prepped with Betadine.  It was entered with a 25-gauge 1-1/2 inch needle and one mL of 1% lidocaine was injected into the skin and subcutaneous tissue.  Then a 22-gauge 3.5 inch spinal needle was inserted into the Left S1 sacral foramen under AP, lateral, and oblique view.  Then a solution containing one mL of 10 mg per mL dexamethasone and 2 mL of 1% lidocaine was injected. This same procedure was repeated on the right side using the same needle injected and technique. The patient tolerated procedure well.  Post procedure instructions were given.  Please see post procedure form.  Pre Injection pain 8/10 Post injection pain 4/10 

## 2013-04-04 NOTE — Progress Notes (Signed)
  PROCEDURE RECORD [facility]   Name: Dominic Fields DOB:1955/06/29 MRN: [id number]  Date:04/04/2013  Physician: Claudette Laws, MD    Nurse/CMA: Kerin Perna  Allergies:  Allergies  Allergen Reactions  . Hydrocodone-Acetaminophen     Consent Signed: yes  Is patient diabetic? no    Pregnant: no LMP: No LMP for male patient. (age 56-55)  Anticoagulants: no Anti-inflammatory: no Antibiotics: no  Procedure: bilateral s1 transforaminal injection  Position: Prone Start Time:  1228 End Time:  1234 Fluoro Time: 23  RN/CMA Levens,CMA Levens,CMA    Time 1122 1236    BP 115/79 124/75    Pulse 77 65    Respirations 16 16    O2 Sat 98 100    S/S 6 6    Pain Level 8/10 4/10     D/C home with Richard-brother, patient A & O X 3, D/C instructions reviewed, and sits independently.

## 2013-07-02 ENCOUNTER — Ambulatory Visit (HOSPITAL_BASED_OUTPATIENT_CLINIC_OR_DEPARTMENT_OTHER): Payer: Medicare HMO | Admitting: Physical Medicine & Rehabilitation

## 2013-07-02 ENCOUNTER — Encounter: Payer: Self-pay | Admitting: Physical Medicine & Rehabilitation

## 2013-07-02 ENCOUNTER — Encounter: Payer: Medicare HMO | Attending: Physical Medicine & Rehabilitation

## 2013-07-02 VITALS — BP 126/76 | HR 72 | Resp 14 | Ht 71.0 in | Wt 199.0 lb

## 2013-07-02 DIAGNOSIS — IMO0002 Reserved for concepts with insufficient information to code with codable children: Secondary | ICD-10-CM

## 2013-07-02 DIAGNOSIS — M545 Low back pain, unspecified: Secondary | ICD-10-CM | POA: Insufficient documentation

## 2013-07-02 DIAGNOSIS — M5416 Radiculopathy, lumbar region: Secondary | ICD-10-CM

## 2013-07-02 DIAGNOSIS — S069XAS Unspecified intracranial injury with loss of consciousness status unknown, sequela: Secondary | ICD-10-CM | POA: Insufficient documentation

## 2013-07-02 DIAGNOSIS — M48062 Spinal stenosis, lumbar region with neurogenic claudication: Secondary | ICD-10-CM | POA: Insufficient documentation

## 2013-07-02 DIAGNOSIS — R413 Other amnesia: Secondary | ICD-10-CM | POA: Insufficient documentation

## 2013-07-02 DIAGNOSIS — S069X9S Unspecified intracranial injury with loss of consciousness of unspecified duration, sequela: Secondary | ICD-10-CM | POA: Insufficient documentation

## 2013-07-02 DIAGNOSIS — M47817 Spondylosis without myelopathy or radiculopathy, lumbosacral region: Secondary | ICD-10-CM | POA: Insufficient documentation

## 2013-07-02 DIAGNOSIS — R279 Unspecified lack of coordination: Secondary | ICD-10-CM | POA: Insufficient documentation

## 2013-07-02 MED ORDER — TRAMADOL HCL 50 MG PO TABS: 50.0000 mg | ORAL_TABLET | Freq: Three times a day (TID) | ORAL | Status: DC | PRN

## 2013-07-02 MED ORDER — NAPROXEN 500 MG PO TABS: 500.0000 mg | ORAL_TABLET | Freq: Two times a day (BID) | ORAL | Status: DC

## 2013-07-02 NOTE — Progress Notes (Signed)
  PROCEDURE RECORD [facility]   Name: Dominic Fields DOB:1955-08-17 MRN: [id number]  Date:07/02/2013  Physician: Alysia Penna, MD    Nurse/CMA: Redgie Grayer  Allergies:  Allergies  Allergen Reactions  . Hydrocodone-Acetaminophen     Consent Signed: yes  Is patient diabetic? no    Pregnant: no LMP: No LMP for male patient. (age 57-55)  Anticoagulants: no Anti-inflammatory: no Antibiotics: no  Procedure:  Bilateral S1 injection Position: Prone Start Time: 1244 End Time: 1256  Fluoro Time: 32  RN/CMA Behr Cislo,CMA Leandrew Keech,CMA    Time 1208 1257    BP 126/76 130/81    Pulse 72 68    Respirations 14 14    O2 Sat 98 99    S/S 6 6    Pain Level 8/10 4/10     D/C home with Wife Thayer Headings, patient A & O X 3, D/C instructions reviewed, and sits independently.

## 2013-07-02 NOTE — Progress Notes (Addendum)
Bi lateral S1 transforaminal injection Indication for lumbar postlaminectomy syndrome with pain radiating from the back to both feet Lumbar transforaminal epidural steroid injection under fluoroscopic guidance  Indication: Lumbosacral radiculitis is not relieved by medication management or other conservative care and interfering with self-care and mobility.   Informed consent was obtained after describing risk and benefits of the procedure with the patient, this includes bleeding, bruising, infection, paralysis and medication side effects.  The patient wishes to proceed and has given written consent.  Patient was placed in prone position.  The lumbar area was marked and prepped with Betadine.  It was entered with a 25-gauge 1-1/2 inch needle and one mL of 1% lidocaine was injected into the skin and subcutaneous tissue.  Then a 22-gauge 3.5 inch spinal needle was inserted into the Left S1 sacral foramen under AP, lateral, and oblique view.  Then a solution containing one mL of 10 mg per mL dexamethasone and 2 mL of 1% lidocaine was injected. This same procedure was repeated on the right side using the same needle injected and technique. The patient tolerated procedure well.  Post procedure instructions were given.  Please see post procedure form.  Pre Injection pain 8/10 Post injection pain 4/10

## 2013-07-02 NOTE — Patient Instructions (Signed)

## 2013-10-01 ENCOUNTER — Ambulatory Visit: Payer: Medicare Other | Admitting: Physical Medicine & Rehabilitation

## 2013-10-03 ENCOUNTER — Ambulatory Visit: Payer: Medicare HMO | Admitting: Physical Medicine & Rehabilitation

## 2013-10-03 ENCOUNTER — Encounter: Payer: Medicare HMO | Attending: Physical Medicine & Rehabilitation

## 2013-10-03 DIAGNOSIS — M48062 Spinal stenosis, lumbar region with neurogenic claudication: Secondary | ICD-10-CM | POA: Insufficient documentation

## 2013-10-03 DIAGNOSIS — R279 Unspecified lack of coordination: Secondary | ICD-10-CM | POA: Insufficient documentation

## 2013-10-03 DIAGNOSIS — S069XAS Unspecified intracranial injury with loss of consciousness status unknown, sequela: Secondary | ICD-10-CM | POA: Insufficient documentation

## 2013-10-03 DIAGNOSIS — R413 Other amnesia: Secondary | ICD-10-CM | POA: Insufficient documentation

## 2013-10-03 DIAGNOSIS — M545 Low back pain, unspecified: Secondary | ICD-10-CM | POA: Insufficient documentation

## 2013-10-03 DIAGNOSIS — S069X9S Unspecified intracranial injury with loss of consciousness of unspecified duration, sequela: Secondary | ICD-10-CM | POA: Insufficient documentation

## 2013-10-03 DIAGNOSIS — M47817 Spondylosis without myelopathy or radiculopathy, lumbosacral region: Secondary | ICD-10-CM | POA: Insufficient documentation

## 2014-06-06 ENCOUNTER — Other Ambulatory Visit: Payer: Self-pay | Admitting: Orthopaedic Surgery

## 2014-06-06 DIAGNOSIS — M961 Postlaminectomy syndrome, not elsewhere classified: Secondary | ICD-10-CM

## 2014-06-06 DIAGNOSIS — M5136 Other intervertebral disc degeneration, lumbar region: Secondary | ICD-10-CM

## 2014-06-06 DIAGNOSIS — M4726 Other spondylosis with radiculopathy, lumbar region: Secondary | ICD-10-CM

## 2014-06-06 DIAGNOSIS — Z139 Encounter for screening, unspecified: Secondary | ICD-10-CM

## 2014-06-18 ENCOUNTER — Inpatient Hospital Stay: Admission: RE | Admit: 2014-06-18 | Payer: Medicare HMO | Source: Ambulatory Visit

## 2014-06-25 ENCOUNTER — Ambulatory Visit
Admission: RE | Admit: 2014-06-25 | Discharge: 2014-06-25 | Disposition: A | Discharge status: Home / Self Care | Payer: Medicare HMO | Source: Ambulatory Visit | Attending: Orthopaedic Surgery | Admitting: Orthopaedic Surgery

## 2014-06-25 DIAGNOSIS — Z139 Encounter for screening, unspecified: Secondary | ICD-10-CM

## 2014-06-25 DIAGNOSIS — M5136 Other intervertebral disc degeneration, lumbar region: Secondary | ICD-10-CM

## 2014-06-25 DIAGNOSIS — M961 Postlaminectomy syndrome, not elsewhere classified: Secondary | ICD-10-CM

## 2014-06-30 ENCOUNTER — Ambulatory Visit
Admission: RE | Admit: 2014-06-30 | Discharge: 2014-06-30 | Disposition: A | Discharge status: Home / Self Care | Payer: Medicare HMO | Source: Ambulatory Visit | Attending: Orthopaedic Surgery | Admitting: Orthopaedic Surgery

## 2014-06-30 DIAGNOSIS — M961 Postlaminectomy syndrome, not elsewhere classified: Secondary | ICD-10-CM

## 2014-06-30 DIAGNOSIS — M5136 Other intervertebral disc degeneration, lumbar region: Secondary | ICD-10-CM

## 2014-06-30 DIAGNOSIS — M4726 Other spondylosis with radiculopathy, lumbar region: Secondary | ICD-10-CM

## 2014-06-30 MED ORDER — GADOBENATE DIMEGLUMINE 529 MG/ML IV SOLN
18.0000 mL | Freq: Once | INTRAVENOUS | Status: AC | PRN
  Administered 2014-06-30: 18 mL via INTRAVENOUS

## 2014-09-19 ENCOUNTER — Ambulatory Visit (HOSPITAL_COMMUNITY)
Admission: RE | Admit: 2014-09-19 | Discharge: 2014-09-19 | Disposition: A | Discharge status: Home / Self Care | Payer: Medicare HMO | Source: Ambulatory Visit | Attending: Orthopaedic Surgery | Admitting: Orthopaedic Surgery

## 2014-09-19 ENCOUNTER — Other Ambulatory Visit (HOSPITAL_COMMUNITY): Payer: Self-pay | Admitting: Orthopaedic Surgery

## 2014-09-19 DIAGNOSIS — M7989 Other specified soft tissue disorders: Secondary | ICD-10-CM | POA: Diagnosis not present

## 2014-09-19 DIAGNOSIS — M79662 Pain in left lower leg: Secondary | ICD-10-CM

## 2014-09-19 DIAGNOSIS — M79605 Pain in left leg: Secondary | ICD-10-CM | POA: Diagnosis present

## 2014-09-19 NOTE — Progress Notes (Signed)
Preliminary results by tech - Left lower extremity venous duplex completed. Negative for deep and superficial vein thrombosis in the left lower extremity. Dr. Towanda Malkin office was given results. Oda Cogan, BS, RDMS, RVT

## 2015-03-10 ENCOUNTER — Emergency Department (HOSPITAL_COMMUNITY): Payer: Medicare HMO

## 2015-03-10 ENCOUNTER — Encounter (HOSPITAL_COMMUNITY): Payer: Self-pay | Admitting: Emergency Medicine

## 2015-03-10 ENCOUNTER — Emergency Department (HOSPITAL_COMMUNITY)
Admission: EM | Admit: 2015-03-10 | Discharge: 2015-03-10 | Disposition: A | Discharge status: Home / Self Care | Payer: Medicare HMO | Attending: Emergency Medicine | Admitting: Emergency Medicine

## 2015-03-10 DIAGNOSIS — F17210 Nicotine dependence, cigarettes, uncomplicated: Secondary | ICD-10-CM | POA: Insufficient documentation

## 2015-03-10 DIAGNOSIS — Z79899 Other long term (current) drug therapy: Secondary | ICD-10-CM | POA: Insufficient documentation

## 2015-03-10 DIAGNOSIS — Y9339 Activity, other involving climbing, rappelling and jumping off: Secondary | ICD-10-CM | POA: Insufficient documentation

## 2015-03-10 DIAGNOSIS — Y9289 Other specified places as the place of occurrence of the external cause: Secondary | ICD-10-CM | POA: Insufficient documentation

## 2015-03-10 DIAGNOSIS — S61011A Laceration without foreign body of right thumb without damage to nail, initial encounter: Secondary | ICD-10-CM | POA: Insufficient documentation

## 2015-03-10 DIAGNOSIS — J45909 Unspecified asthma, uncomplicated: Secondary | ICD-10-CM | POA: Diagnosis not present

## 2015-03-10 DIAGNOSIS — Y998 Other external cause status: Secondary | ICD-10-CM | POA: Diagnosis not present

## 2015-03-10 DIAGNOSIS — W293XXA Contact with powered garden and outdoor hand tools and machinery, initial encounter: Secondary | ICD-10-CM | POA: Insufficient documentation

## 2015-03-10 DIAGNOSIS — Z23 Encounter for immunization: Secondary | ICD-10-CM | POA: Diagnosis not present

## 2015-03-10 DIAGNOSIS — S6991XA Unspecified injury of right wrist, hand and finger(s), initial encounter: Secondary | ICD-10-CM | POA: Diagnosis present

## 2015-03-10 MED ORDER — TETANUS-DIPHTH-ACELL PERTUSSIS 5-2.5-18.5 LF-MCG/0.5 IM SUSP
0.5000 mL | Freq: Once | INTRAMUSCULAR | Status: AC
  Administered 2015-03-10: 0.5 mL via INTRAMUSCULAR
  Filled 2015-03-10: qty 0.5

## 2015-03-10 MED ORDER — AMOXICILLIN-POT CLAVULANATE 875-125 MG PO TABS: 1.0000 | ORAL_TABLET | Freq: Two times a day (BID) | ORAL | Status: DC

## 2015-03-10 NOTE — ED Notes (Signed)
Pt back from x-ray.
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
## 2015-03-10 NOTE — Discharge Instructions (Signed)
Return here as needed.  Follow-up with the hand surgeon as needed.  Keep the area clean and dry.  Cover the area if there is any possible exposure to any contamination

## 2015-03-10 NOTE — ED Provider Notes (Signed)
CSN: [id number]     Arrival date & time 03/10/15  1102 History   First MD Initiated Contact with Patient 03/10/15 1136     Chief Complaint  Patient presents with  . Finger Injury     (Consider location/radiation/quality/duration/timing/severity/associated sxs/prior Treatment) HPI Patient presents to the emergency department with a right thumb injury that occurred while he was cutting branches with a chainsaw.  The patient states change slipped and he went to grab it and it caught his thumb.  Patient states he has normal range of motion of the thumb.  Patient denies nausea, vomiting, headache, blurred vision, back pain, neck pain, loss of consciousness or weakness.  The patient states that he did not take any medications prior to arrival.  He states that he did apply pressure to the area Past Medical History  Diagnosis Date  . Asthma, chronic 09/03/2012   Past Surgical History  Procedure Laterality Date  . Back surgery    . Spine surgery     History reviewed. No pertinent family history. Social History  Substance Use Topics  . Smoking status: Light Tobacco Smoker    Types: Cigarettes  . Smokeless tobacco: Former Systems developer  . Alcohol Use: Yes    Review of Systems   All other systems negative except as documented in the HPI. All pertinent positives and negatives as reviewed in the HPI. Allergies  Hydrocodone-acetaminophen  Home Medications   Prior to Admission medications   Medication Sig Start Date End Date Taking? Authorizing Provider  gabapentin (NEURONTIN) 300 MG capsule Take 1,200 mg by mouth 2 (two) times daily. 12/12/14  Yes Historical Provider, MD  oxyCODONE-acetaminophen (PERCOCET) 10-325 MG tablet Take 1 tablet by mouth every 6 (six) hours as needed. 01/29/15  Yes Historical Provider, MD  naproxen (NAPROSYN) 500 MG tablet Take 1 tablet (500 mg total) by mouth 2 (two) times daily with a meal. Patient not taking: Reported on 03/10/2015 07/02/13   Charlett Blake, MD   traMADol (ULTRAM) 50 MG tablet Take 1 tablet (50 mg total) by mouth every 8 (eight) hours as needed. Patient not taking: Reported on 03/10/2015 07/02/13   Charlett Blake, MD   BP 131/87 mmHg  Pulse 64  Temp(Src) 98 F (36.7 C) (Oral)  Resp 20  SpO2 95% Physical Exam  Constitutional: He is oriented to person, place, and time. He appears well-developed and well-nourished. No distress.  HENT:  Head: Normocephalic and atraumatic.  Neurological: He is alert and oriented to person, place, and time. He exhibits normal muscle tone. Coordination normal.  Skin: Skin is warm and dry. No rash noted. No erythema.    ED Course  Procedures (including critical care time) Labs Review Labs Reviewed - No data to display  Imaging Review Dg Finger Thumb Right  03/10/2015  CLINICAL DATA:  Status post chain cell injury. EXAM: RIGHT THUMB 2+V COMPARISON:  None. FINDINGS: No acute fracture or subluxation identified. K-wire is identified within the midshaft of the proximal phalanx. There is a chronic appearing fracture deformity involves the volar plate of the distal phalanx. No acute fracture or subluxation. IMPRESSION: 1. No acute bone abnormalities. 2. Old posttraumatic deformities as above. Electronically Signed   By: Kerby Moors M.D.   On: 03/10/2015 13:04   I have personally reviewed and evaluated these images and lab results as part of my medical decision-making.  The patient states he does not want sutures in the wound.  He does have full range of motion of the finger.  Normal sensation of explored the wound and there is no exposed tendon injury.  The x-ray does not show any broken bones or other abnormalities.  Patient will be placed on antibiotics and advised him that he will need to keep the area clean and dry.  Says he has refused suturing the wound may take longer to heal    Dalia Heading, PA-C 03/10/15 1430  Leo Grosser, MD 03/11/15 [phone]

## 2015-06-26 ENCOUNTER — Other Ambulatory Visit: Payer: Self-pay | Admitting: Orthopaedic Surgery

## 2015-06-26 DIAGNOSIS — M4326 Fusion of spine, lumbar region: Secondary | ICD-10-CM

## 2015-07-05 ENCOUNTER — Ambulatory Visit
Admission: RE | Admit: 2015-07-05 | Discharge: 2015-07-05 | Disposition: A | Discharge status: Home / Self Care | Payer: Medicare HMO | Source: Ambulatory Visit | Attending: Orthopaedic Surgery | Admitting: Orthopaedic Surgery

## 2015-07-05 DIAGNOSIS — M4326 Fusion of spine, lumbar region: Secondary | ICD-10-CM

## 2015-07-05 MED ORDER — GADOBENATE DIMEGLUMINE 529 MG/ML IV SOLN
18.0000 mL | Freq: Once | INTRAVENOUS | Status: AC | PRN
  Administered 2015-07-05: 18 mL via INTRAVENOUS

## 2016-10-10 ENCOUNTER — Emergency Department (HOSPITAL_COMMUNITY): Payer: Medicare HMO

## 2016-10-10 ENCOUNTER — Emergency Department (HOSPITAL_COMMUNITY)
Admission: EM | Admit: 2016-10-10 | Discharge: 2016-10-10 | Disposition: A | Discharge status: Home / Self Care | Payer: Medicare HMO | Attending: Emergency Medicine | Admitting: Emergency Medicine

## 2016-10-10 ENCOUNTER — Encounter (HOSPITAL_COMMUNITY): Payer: Self-pay | Admitting: *Deleted

## 2016-10-10 DIAGNOSIS — J45909 Unspecified asthma, uncomplicated: Secondary | ICD-10-CM | POA: Insufficient documentation

## 2016-10-10 DIAGNOSIS — Y9301 Activity, walking, marching and hiking: Secondary | ICD-10-CM | POA: Diagnosis not present

## 2016-10-10 DIAGNOSIS — Y92007 Garden or yard of unspecified non-institutional (private) residence as the place of occurrence of the external cause: Secondary | ICD-10-CM | POA: Diagnosis not present

## 2016-10-10 DIAGNOSIS — W010XXA Fall on same level from slipping, tripping and stumbling without subsequent striking against object, initial encounter: Secondary | ICD-10-CM | POA: Insufficient documentation

## 2016-10-10 DIAGNOSIS — Y999 Unspecified external cause status: Secondary | ICD-10-CM | POA: Diagnosis not present

## 2016-10-10 DIAGNOSIS — S62316A Displaced fracture of base of fifth metacarpal bone, right hand, initial encounter for closed fracture: Secondary | ICD-10-CM | POA: Diagnosis not present

## 2016-10-10 DIAGNOSIS — Z87891 Personal history of nicotine dependence: Secondary | ICD-10-CM | POA: Diagnosis not present

## 2016-10-10 DIAGNOSIS — S6991XA Unspecified injury of right wrist, hand and finger(s), initial encounter: Secondary | ICD-10-CM | POA: Diagnosis present

## 2016-10-10 MED ORDER — NAPROXEN 250 MG PO TABS
250.0000 mg | ORAL_TABLET | Freq: Once | ORAL | Status: AC
  Administered 2016-10-10: 250 mg via ORAL
  Filled 2016-10-10: qty 1

## 2016-10-10 NOTE — ED Notes (Signed)
Pt understood dc material. NAD noted. 

## 2016-10-10 NOTE — ED Provider Notes (Signed)
[facility] DEPT Provider Note   CSN: [id number] Arrival date & time: 10/10/16  0530     History   Chief Complaint Chief Complaint  Patient presents with  . Wrist Pain    HPI Dominic Fields is a 61 y.o. male.  Dominic Fields is a 61 y.o. for a trip and fall 4 days ago complaining of right hand pain. Patient reports he was walking outside when he tripped on a transition from the yard to cement that was elevated higher than the yard. He reports falling and landing on his hand outstretched. He denies sitting his head or loss of consciousness. He complains of pain throughout his hand that is worse on the ulnar side. He's been taking his home pain medication of Percocet with some relief. He also reports wrapping the hand and wrist with some relief. He reports some slight tingling to his right fifth finger. He denies numbness, weakness, head injury, syncope, chest pain, shortness of breath or other injury.   The history is provided by the patient and medical records. No language interpreter was used.  Wrist Pain  Pertinent negatives include no chest pain and no shortness of breath.    History reviewed. No pertinent past medical history.  Patient Active Problem List   Diagnosis Date Noted  . Lumbar radicular pain 04/04/2013  . 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 Closed TBI (traumatic brain injury) ([place]) 12/14/2012  . Lumbosacral spondylosis without myelopathy 12/14/2012  . Chronic back pain 09/03/2012  . Short-term memory loss 09/03/2012  . Asthma, chronic 09/03/2012    Past Surgical History:  Procedure Laterality Date  . BACK SURGERY    . SPINE SURGERY         Home Medications    Prior to Admission medications   Medication Sig Start Date End Date Taking? Authorizing Provider  amoxicillin-clavulanate (AUGMENTIN) 875-125 MG tablet Take 1 tablet by mouth every 12 (twelve) hours. 03/10/15   Lawyer, Harrell Gave, PA-C  gabapentin  (NEURONTIN) 300 MG capsule Take 1,200 mg by mouth 2 (two) times daily. 12/12/14   [provider]  naproxen (NAPROSYN) 500 MG tablet Take 1 tablet (500 mg total) by mouth 2 (two) times daily with a meal. Patient not taking: Reported on 03/10/2015 07/02/13   Charlett Blake, MD  oxyCODONE-acetaminophen (PERCOCET) 10-325 MG tablet Take 1 tablet by mouth every 6 (six) hours as needed. 01/29/15   [provider]  traMADol (ULTRAM) 50 MG tablet Take 1 tablet (50 mg total) by mouth every 8 (eight) hours as needed. Patient not taking: Reported on 03/10/2015 07/02/13   Charlett Blake, MD    Family History History reviewed. No pertinent family history.  Social History Social History  Substance Use Topics  . Smoking status: Former Smoker    Types: Cigarettes  . Smokeless tobacco: Former Systems developer  . Alcohol use No     Allergies   Hydrocodone-acetaminophen   Review of Systems Review of Systems  Constitutional: Negative for fever.  Respiratory: Negative for shortness of breath.   Cardiovascular: Negative for chest pain.  Musculoskeletal: Positive for arthralgias and joint swelling.  Skin: Negative for wound.  Neurological: Negative for syncope, weakness and numbness.     Physical Exam Updated Vital Signs BP 121/85   Pulse 68   Temp 97.8 F (36.6 C) (Oral)   Resp 18   Ht 5\' 11"  (1.803 m)   Wt 89.4 kg (197 lb)   SpO2 99%   BMI 27.48  kg/m   Physical Exam  Constitutional: He appears well-developed and well-nourished. No distress.  HENT:  Head: Normocephalic and atraumatic.  Eyes: Right eye exhibits no discharge. Left eye exhibits no discharge.  Cardiovascular: Normal rate, regular rhythm and intact distal pulses.   Bilateral radial pulses are intact. Good capillary refill to his bilateral fingertips.  Pulmonary/Chest: Effort normal. No respiratory distress.  Musculoskeletal: He exhibits edema and tenderness. He exhibits no deformity.  Patient has mild to  moderate edema noted throughout his right hand with TTP to ulnar aspect of right hand. No deformity. Knuckles are in alignment. Compartments are soft. Good capillary refill.   Neurological: He is alert. No sensory deficit. Coordination normal.  Sensation is intact to his bilateral fingertips.   Skin: Skin is warm and dry. Capillary refill takes less than 2 seconds. No rash noted. He is not diaphoretic. No erythema. No pallor.  Psychiatric: He has a normal mood and affect. His behavior is normal.  Nursing note and vitals reviewed.    ED Treatments / Results  Labs (all labs ordered are listed, but only abnormal results are displayed) Labs Reviewed - No data to display  EKG  EKG Interpretation None       Radiology Dg Wrist Complete Right  Result Date: 10/10/2016 CLINICAL DATA:  Persistent pain after falling 2 days ago EXAM: RIGHT WRIST - COMPLETE 3+ VIEW COMPARISON:  None. FINDINGS: There is a fifth metacarpal base fracture with mild comminution and dorsal displacement. No dislocation. There are severe first carpometacarpal degenerative changes with subchondral degenerative cyst formation, osteophyte formation and mild radial subluxation. Remote 10 fragment in the first proximal phalanx as clinically described. IMPRESSION: 1. Fifth metacarpal base fractures with dorsal displacement. No dislocation. 2. Severe first CMC arthritis. Electronically Signed   By: Andreas Newport M.D.   On: 10/10/2016 06:23   Dg Hand Complete Right  Result Date: 10/10/2016 CLINICAL DATA:  Persistent pain after falling 2 days ago. EXAM: RIGHT HAND - COMPLETE 3+ VIEW COMPARISON:  None. FINDINGS: Fifth metacarpal base fracture with 1/2 shaft width dorsal displacement. No dislocation. First proximal phalangeal pain fragment. Severe first carpometacarpal arthritis with radial subluxation. Moderately severe arthritis at the second and third MCP joints. Mild to moderate interphalangeal joint arthritis. IMPRESSION: 1.  Fifth metacarpal base fracture 2. Arthritic changes, greatest at the first [facility]. Electronically Signed   By: Andreas Newport M.D.   On: 10/10/2016 06:25    Procedures Procedures (including critical care time)  Medications Ordered in ED Medications  naproxen (NAPROSYN) tablet 250 mg (250 mg Oral Given 10/10/16 0645)     Initial Impression / Assessment and Plan / ED Course  I have reviewed the triage vital signs and the nursing notes.  Pertinent labs & imaging results that were available during my care of the patient were reviewed by me and considered in my medical decision making (see chart for details).    This is a 61 y.o. for a trip and fall 4 days ago complaining of right hand pain. Patient reports he was walking outside when he tripped on a transition from the yard to cement that was elevated higher than the yard. He reports falling and landing on his hand outstretched. He denies sitting his head or loss of consciousness. He complains of pain throughout his hand that is worse on the ulnar side. He's been taking his home pain medication of Percocet with some relief. He also reports wrapping the hand and wrist with some relief.  On exam  patient is afebrile nontoxic appearing. His tenderness to the ulnar aspect of his right hand without deformity. He is neurovascularly intact. X-ray shows a fifth metacarpal base fracture with a half shaft width dorsal displacement. No dislocation. Will place in ulnar gutter splint and have him follow up with Dr. Fredna Dow. He has pain medication at home he can take for pain. I educated on ice and elevation to help reduce swelling. Return precautions discussed. Splint care and precautions discussed. I advised the patient to follow-up with their primary care provider this week. I advised the patient to return to the emergency department with new or worsening symptoms or new concerns. The patient verbalized understanding and agreement with plan.      Final Clinical  Impressions(s) / ED Diagnoses   Final diagnoses:  Displaced fracture of base of fifth metacarpal bone, right hand, initial encounter for closed fracture    New Prescriptions New Prescriptions   No medications on file     Sharmaine Base 10/10/16 1443    Ripley Fraise, MD 10/10/16 [phone]

## 2016-10-10 NOTE — ED Notes (Signed)
Ortho tech paged  

## 2016-10-10 NOTE — ED Triage Notes (Signed)
States he fell and was trying to catch himself and landed on his right hand

## 2016-10-12 ENCOUNTER — Ambulatory Visit (INDEPENDENT_AMBULATORY_CARE_PROVIDER_SITE_OTHER): Payer: Self-pay | Admitting: Orthopedic Surgery

## 2016-10-20 ENCOUNTER — Other Ambulatory Visit: Payer: Self-pay | Admitting: Orthopedic Surgery

## 2016-10-20 ENCOUNTER — Encounter (HOSPITAL_COMMUNITY): Payer: Self-pay

## 2016-10-20 NOTE — Progress Notes (Signed)
   10/20/16 1437  OBSTRUCTIVE SLEEP APNEA  Have you ever been diagnosed with sleep apnea through a sleep study? No  Do you snore loudly (loud enough to be heard through closed doors)?  1  Do you often feel tired, fatigued, or sleepy during the daytime (such as falling asleep during driving or talking to someone)? 1  Has anyone observed you stop breathing during your sleep? 0  Do you have, or are you being treated for high blood pressure? 0  Age > 50 (1-yes) 1  Neck circumference greater than:Male 16 inches or larger, Male 17inches or larger? 1  Male Gender (Yes=1) 1  Obstructive Sleep Apnea Score 5  Score 5 or greater  Results sent to PCP

## 2016-10-20 NOTE — Progress Notes (Signed)
Levada Dy, [place] for anesthesia, made aware of cardiac findings and + Stop Bang. Levada Dy sts to repeat EKG tomorrow. Pt's chart needs no further review.

## 2016-10-20 NOTE — Progress Notes (Addendum)
PCP - Denies Cardiologist -  Denies  Chest x-ray - Denies EKG - 08/28/15 Stress Test -  11/23/09 ECHO -  42yrs Cardiac Cath -  Denies  Sleep Study - Denies CPAP - None   Pt denies having chest pain, sob, or fever at this time. Pt sts he had an MI in 1995 and stress test on 11/23/09, but has no history of HTN or continued chest pain. Due to these findings, the chart will be given to anesthesia for review.   Pt told he can have the following medications with a small sip of water on the day of surgery: Gabapentin (NEURONTIN). OxyCODONE-acetaminophen (PERCOCET), as needed for pain. Pt told to stop as of now: All Aspirins, Vitamins, Fish oils, and Herbal medicaitons. Also stop all NSAIDS i.e. Advil, Motrin, Aleve, Anaprox, Naproxen, BC and Goody Powders. All instructions explained to the pt, with a verbal understanding of the material. The opportunity to ask questions was provided.

## 2016-10-21 ENCOUNTER — Ambulatory Visit (INDEPENDENT_AMBULATORY_CARE_PROVIDER_SITE_OTHER): Payer: Medicare HMO | Admitting: Orthopedic Surgery

## 2016-10-21 ENCOUNTER — Ambulatory Visit (HOSPITAL_COMMUNITY): Payer: Medicare HMO | Admitting: Emergency Medicine

## 2016-10-21 ENCOUNTER — Encounter (HOSPITAL_COMMUNITY)
Admission: RE | Disposition: A | Discharge status: Home / Self Care | Payer: Self-pay | Source: Ambulatory Visit | Attending: Orthopedic Surgery

## 2016-10-21 ENCOUNTER — Encounter (HOSPITAL_COMMUNITY): Payer: Self-pay | Admitting: *Deleted

## 2016-10-21 ENCOUNTER — Ambulatory Visit (HOSPITAL_COMMUNITY)
Admission: RE | Admit: 2016-10-21 | Discharge: 2016-10-21 | Disposition: A | Discharge status: Home / Self Care | Payer: Medicare HMO | Source: Ambulatory Visit | Attending: Orthopedic Surgery | Admitting: Orthopedic Surgery

## 2016-10-21 DIAGNOSIS — Y831 Surgical operation with implant of artificial internal device as the cause of abnormal reaction of the patient, or of later complication, without mention of misadventure at the time of the procedure: Secondary | ICD-10-CM | POA: Insufficient documentation

## 2016-10-21 DIAGNOSIS — T8484XA Pain due to internal orthopedic prosthetic devices, implants and grafts, initial encounter: Secondary | ICD-10-CM | POA: Insufficient documentation

## 2016-10-21 DIAGNOSIS — I252 Old myocardial infarction: Secondary | ICD-10-CM | POA: Diagnosis not present

## 2016-10-21 DIAGNOSIS — Z87892 Personal history of anaphylaxis: Secondary | ICD-10-CM | POA: Diagnosis not present

## 2016-10-21 DIAGNOSIS — X58XXXA Exposure to other specified factors, initial encounter: Secondary | ICD-10-CM | POA: Diagnosis not present

## 2016-10-21 DIAGNOSIS — Z87891 Personal history of nicotine dependence: Secondary | ICD-10-CM | POA: Insufficient documentation

## 2016-10-21 DIAGNOSIS — I4510 Unspecified right bundle-branch block: Secondary | ICD-10-CM | POA: Diagnosis not present

## 2016-10-21 DIAGNOSIS — Z9889 Other specified postprocedural states: Secondary | ICD-10-CM | POA: Insufficient documentation

## 2016-10-21 DIAGNOSIS — Z79899 Other long term (current) drug therapy: Secondary | ICD-10-CM | POA: Diagnosis not present

## 2016-10-21 DIAGNOSIS — Z885 Allergy status to narcotic agent status: Secondary | ICD-10-CM | POA: Insufficient documentation

## 2016-10-21 DIAGNOSIS — S62316A Displaced fracture of base of fifth metacarpal bone, right hand, initial encounter for closed fracture: Secondary | ICD-10-CM | POA: Diagnosis not present

## 2016-10-21 DIAGNOSIS — Z87820 Personal history of traumatic brain injury: Secondary | ICD-10-CM | POA: Insufficient documentation

## 2016-10-21 HISTORY — DX: Adverse effect of unspecified anesthetic, initial encounter: T41.45XA

## 2016-10-21 HISTORY — DX: Unspecified asthma, uncomplicated: J45.909

## 2016-10-21 HISTORY — DX: Unspecified osteoarthritis, unspecified site: M19.90

## 2016-10-21 HISTORY — DX: Acute myocardial infarction, unspecified: I21.9

## 2016-10-21 HISTORY — PX: HARDWARE REMOVAL: SHX979

## 2016-10-21 HISTORY — DX: Other complications of anesthesia, initial encounter: T88.59XA

## 2016-10-21 HISTORY — DX: Nontraumatic intracerebral hemorrhage, unspecified: I61.9

## 2016-10-21 HISTORY — DX: Spinal stenosis, site unspecified: M48.00

## 2016-10-21 HISTORY — PX: CLOSED REDUCTION METACARPAL WITH PERCUTANEOUS PINNING: SHX5613

## 2016-10-21 LAB — CBC
HCT: 39.2 % (ref 39.0–52.0)
Hemoglobin: 13.1 g/dL (ref 13.0–17.0)
MCH: 29.1 pg (ref 26.0–34.0)
MCHC: 33.4 g/dL (ref 30.0–36.0)
MCV: 87.1 fL (ref 78.0–100.0)
Platelets: 331 10*3/uL (ref 150–400)
RBC: 4.5 MIL/uL (ref 4.22–5.81)
RDW: 13.9 % (ref 11.5–15.5)
WBC: 6.8 10*3/uL (ref 4.0–10.5)

## 2016-10-21 LAB — COMPREHENSIVE METABOLIC PANEL
ALT: 15 U/L — ABNORMAL LOW (ref 17–63)
AST: 23 U/L (ref 15–41)
Albumin: 3.4 g/dL — ABNORMAL LOW (ref 3.5–5.0)
Alkaline Phosphatase: 45 U/L (ref 38–126)
Anion gap: 8 (ref 5–15)
BUN: 11 mg/dL (ref 6–20)
CO2: 21 mmol/L — ABNORMAL LOW (ref 22–32)
Calcium: 8.5 mg/dL — ABNORMAL LOW (ref 8.9–10.3)
Chloride: 110 mmol/L (ref 101–111)
Creatinine, Ser: 1.03 mg/dL (ref 0.61–1.24)
GFR calc Af Amer: >60 mL/min (ref >60)
GFR calc non Af Amer: >60 mL/min (ref >60)
Glucose, Bld: 95 mg/dL (ref 65–99)
Potassium: 3.7 mmol/L (ref 3.5–5.1)
Sodium: 139 mmol/L (ref 135–145)
Total Bilirubin: 1 mg/dL (ref 0.3–1.2)
Total Protein: 6.3 g/dL — ABNORMAL LOW (ref 6.5–8.1)

## 2016-10-21 SURGERY — CLOSED REDUCTION, FRACTURE, METACARPAL BONE, WITH PERCUTANEOUS PINNING
Anesthesia: General | Site: Finger | Laterality: Right

## 2016-10-21 MED ORDER — PROPOFOL 10 MG/ML IV BOLUS
INTRAVENOUS | Status: AC
  Filled 2016-10-21: qty 20

## 2016-10-21 MED ORDER — FENTANYL CITRATE (PF) 100 MCG/2ML IJ SOLN
INTRAMUSCULAR | Status: AC
  Filled 2016-10-21: qty 2

## 2016-10-21 MED ORDER — FENTANYL CITRATE (PF) 100 MCG/2ML IJ SOLN
25.0000 ug | INTRAMUSCULAR | Status: DC | PRN
  Administered 2016-10-21: 50 ug via INTRAVENOUS

## 2016-10-21 MED ORDER — ONDANSETRON HCL 4 MG/2ML IJ SOLN: 4.0000 mg | Freq: Four times a day (QID) | INTRAMUSCULAR | Status: DC | PRN

## 2016-10-21 MED ORDER — LIDOCAINE HCL (CARDIAC) 20 MG/ML IV SOLN
INTRAVENOUS | Status: DC | PRN
  Administered 2016-10-21: 100 mg via INTRAVENOUS

## 2016-10-21 MED ORDER — FENTANYL CITRATE (PF) 250 MCG/5ML IJ SOLN
INTRAMUSCULAR | Status: AC
  Filled 2016-10-21: qty 5

## 2016-10-21 MED ORDER — MIDAZOLAM HCL 5 MG/5ML IJ SOLN
INTRAMUSCULAR | Status: DC | PRN
  Administered 2016-10-21: 2 mg via INTRAVENOUS

## 2016-10-21 MED ORDER — EPHEDRINE SULFATE 50 MG/ML IJ SOLN
INTRAMUSCULAR | Status: DC | PRN
  Administered 2016-10-21: 10 mg via INTRAVENOUS

## 2016-10-21 MED ORDER — LIDOCAINE HCL (CARDIAC) 20 MG/ML IV SOLN
INTRAVENOUS | Status: AC
  Filled 2016-10-21: qty 5

## 2016-10-21 MED ORDER — PROPOFOL 10 MG/ML IV BOLUS
INTRAVENOUS | Status: DC | PRN
  Administered 2016-10-21: 150 mg via INTRAVENOUS

## 2016-10-21 MED ORDER — ONDANSETRON HCL 4 MG/2ML IJ SOLN
INTRAMUSCULAR | Status: DC | PRN
  Administered 2016-10-21: 4 mg via INTRAVENOUS

## 2016-10-21 MED ORDER — DEXAMETHASONE SODIUM PHOSPHATE 10 MG/ML IJ SOLN
INTRAMUSCULAR | Status: DC | PRN
  Administered 2016-10-21: 10 mg via INTRAVENOUS

## 2016-10-21 MED ORDER — BUPIVACAINE HCL (PF) 0.25 % IJ SOLN
INTRAMUSCULAR | Status: DC | PRN
  Administered 2016-10-21: 9 mL

## 2016-10-21 MED ORDER — MIDAZOLAM HCL 2 MG/2ML IJ SOLN
INTRAMUSCULAR | Status: AC
  Filled 2016-10-21: qty 2

## 2016-10-21 MED ORDER — OXYCODONE-ACETAMINOPHEN 5-325 MG PO TABS
1.0000 | ORAL_TABLET | Freq: Four times a day (QID) | ORAL | 0 refills | Status: DC | PRN
Start: 2016-10-21 — End: 2017-08-13

## 2016-10-21 MED ORDER — CHLORHEXIDINE GLUCONATE 4 % EX LIQD: 60.0000 mL | Freq: Once | CUTANEOUS | Status: DC

## 2016-10-21 MED ORDER — OXYCODONE HCL 5 MG PO TABS
ORAL_TABLET | ORAL | Status: AC
  Filled 2016-10-21: qty 1

## 2016-10-21 MED ORDER — OXYCODONE HCL 5 MG PO TABS
5.0000 mg | ORAL_TABLET | Freq: Once | ORAL | Status: AC | PRN
  Administered 2016-10-21: 5 mg via ORAL

## 2016-10-21 MED ORDER — CEFAZOLIN SODIUM-DEXTROSE 2-4 GM/100ML-% IV SOLN
2.0000 g | INTRAVENOUS | Status: AC
  Administered 2016-10-21: 2 g via INTRAVENOUS

## 2016-10-21 MED ORDER — EPHEDRINE 5 MG/ML INJ
INTRAVENOUS | Status: AC
  Filled 2016-10-21: qty 10

## 2016-10-21 MED ORDER — FENTANYL CITRATE (PF) 100 MCG/2ML IJ SOLN
INTRAMUSCULAR | Status: DC | PRN
  Administered 2016-10-21 (×4): 50 ug via INTRAVENOUS

## 2016-10-21 MED ORDER — LACTATED RINGERS IV SOLN
INTRAVENOUS | Status: DC
  Administered 2016-10-21: 50 mL/h via INTRAVENOUS
  Administered 2016-10-21: 16:00:00 via INTRAVENOUS

## 2016-10-21 MED ORDER — OXYCODONE HCL 5 MG/5ML PO SOLN: 5.0000 mg | Freq: Once | ORAL | Status: AC | PRN

## 2016-10-21 MED ORDER — FENTANYL CITRATE (PF) 100 MCG/2ML IJ SOLN
50.0000 ug | Freq: Once | INTRAMUSCULAR | Status: AC | PRN
  Administered 2016-10-21: 50 ug via INTRAVENOUS
  Filled 2016-10-21: qty 2
  Filled 2016-10-21 (×2): qty 1

## 2016-10-21 MED ORDER — BUPIVACAINE HCL (PF) 0.25 % IJ SOLN
INTRAMUSCULAR | Status: AC
  Filled 2016-10-21: qty 30

## 2016-10-21 MED ORDER — CEFAZOLIN SODIUM-DEXTROSE 2-4 GM/100ML-% IV SOLN
INTRAVENOUS | Status: AC
  Filled 2016-10-21: qty 100

## 2016-10-21 MED ORDER — 0.9 % SODIUM CHLORIDE (POUR BTL) OPTIME
TOPICAL | Status: DC | PRN
  Administered 2016-10-21: 1000 mL

## 2016-10-21 SURGICAL SUPPLY — 53 items
BANDAGE ACE 3X5.8 VEL STRL LF (GAUZE/BANDAGES/DRESSINGS) ×4 IMPLANT
BANDAGE ACE 4X5 VEL STRL LF (GAUZE/BANDAGES/DRESSINGS) ×4 IMPLANT
BNDG CMPR 9X4 STRL LF SNTH (GAUZE/BANDAGES/DRESSINGS) ×2
BNDG ELASTIC 2X5.8 VLCR STR LF (GAUZE/BANDAGES/DRESSINGS) ×4 IMPLANT
BNDG ESMARK 4X9 LF (GAUZE/BANDAGES/DRESSINGS) ×4 IMPLANT
BNDG GAUZE ELAST 4 BULKY (GAUZE/BANDAGES/DRESSINGS) ×4 IMPLANT
CLOSURE WOUND 1/2 X4 (GAUZE/BANDAGES/DRESSINGS)
CORDS BIPOLAR (ELECTRODE) IMPLANT
COVER SURGICAL LIGHT HANDLE (MISCELLANEOUS) ×4 IMPLANT
CUFF TOURNIQUET SINGLE 18IN (TOURNIQUET CUFF) ×4 IMPLANT
DRAPE C-ARM MINI 42X72 WSTRAPS (DRAPES) ×4 IMPLANT
DRAPE SURG 17X23 STRL (DRAPES) ×4 IMPLANT
DURAPREP 26ML APPLICATOR (WOUND CARE) ×4 IMPLANT
ELECT REM PT RETURN 9FT ADLT (ELECTROSURGICAL)
ELECTRODE REM PT RTRN 9FT ADLT (ELECTROSURGICAL) IMPLANT
GAUZE SPONGE 4X4 12PLY STRL (GAUZE/BANDAGES/DRESSINGS) ×4 IMPLANT
GAUZE SPONGE 4X4 12PLY STRL LF (GAUZE/BANDAGES/DRESSINGS) ×4 IMPLANT
GAUZE XEROFORM 1X8 LF (GAUZE/BANDAGES/DRESSINGS) ×4 IMPLANT
GLOVE SURG SYN 8.0 (GLOVE) ×4 IMPLANT
GOWN STRL REUS W/ TWL LRG LVL3 (GOWN DISPOSABLE) ×2 IMPLANT
GOWN STRL REUS W/ TWL XL LVL3 (GOWN DISPOSABLE) ×2 IMPLANT
GOWN STRL REUS W/TWL LRG LVL3 (GOWN DISPOSABLE) ×4
GOWN STRL REUS W/TWL XL LVL3 (GOWN DISPOSABLE) ×4
K-WIRE DBL TROCAR .062X4 ×8 IMPLANT
KIT BASIN OR (CUSTOM PROCEDURE TRAY) ×4 IMPLANT
KIT ROOM TURNOVER OR (KITS) ×4 IMPLANT
KWIRE DBL TROCAR .062X4 ×4 IMPLANT
MANIFOLD NEPTUNE II (INSTRUMENTS) IMPLANT
NEEDLE 22X1 1/2 (OR ONLY) (NEEDLE) IMPLANT
NEEDLE HYPO 25GX1X1/2 BEV (NEEDLE) ×4 IMPLANT
NS IRRIG 1000ML POUR BTL (IV SOLUTION) ×4 IMPLANT
PACK ORTHO EXTREMITY (CUSTOM PROCEDURE TRAY) ×4 IMPLANT
PAD ARMBOARD 7.5X6 YLW CONV (MISCELLANEOUS) ×8 IMPLANT
PAD CAST 3X4 CTTN HI CHSV (CAST SUPPLIES) IMPLANT
PAD CAST 4YDX4 CTTN HI CHSV (CAST SUPPLIES) ×6 IMPLANT
PADDING CAST COTTON 3X4 STRL (CAST SUPPLIES)
PADDING CAST COTTON 4X4 STRL (CAST SUPPLIES) ×12
PENCIL BUTTON HOLSTER BLD 10FT (ELECTRODE) IMPLANT
SPLINT PLASTER CAST XFAST 4X15 (CAST SUPPLIES) ×2 IMPLANT
SPLINT PLASTER XTRA FAST SET 4 (CAST SUPPLIES) ×2
SPONGE LAP 4X18 X RAY DECT (DISPOSABLE) ×4 IMPLANT
STRIP CLOSURE SKIN 1/2X4 (GAUZE/BANDAGES/DRESSINGS) IMPLANT
SUCTION FRAZIER HANDLE 10FR (MISCELLANEOUS)
SUCTION TUBE FRAZIER 10FR DISP (MISCELLANEOUS) IMPLANT
SUT ETHILON 4 0 PS 2 18 (SUTURE) ×4 IMPLANT
SUT PROLENE 3 0 PS 2 (SUTURE) IMPLANT
SYR CONTROL 10ML LL (SYRINGE) ×4 IMPLANT
TOWEL OR 17X24 6PK STRL BLUE (TOWEL DISPOSABLE) ×4 IMPLANT
TOWEL OR 17X26 10 PK STRL BLUE (TOWEL DISPOSABLE) ×4 IMPLANT
TUBE CONNECTING 12'X1/4 (SUCTIONS)
TUBE CONNECTING 12X1/4 (SUCTIONS) IMPLANT
UNDERPAD 30X30 (UNDERPADS AND DIAPERS) ×4 IMPLANT
WATER STERILE IRR 1000ML POUR (IV SOLUTION) ×4 IMPLANT

## 2016-10-21 NOTE — Transfer of Care (Signed)
Immediate Anesthesia Transfer of Care Note  Patient: Dominic Fields  Procedure(s) Performed: Procedure(s): CLOSED REDUCTION METACARPAL WITH PERCUTANEOUS PINNING of right small metacarpal (Right) HARDWARE REMOVAL right thumb (Right)  Patient Location: PACU  Anesthesia Type:General  Level of Consciousness: awake and patient cooperative  Airway & Oxygen Therapy: Patient Spontanous Breathing  Post-op Assessment: Report given to RN and Post -op Vital signs reviewed and stable  Post vital signs: Reviewed and stable  Last Vitals:  Vitals:   10/21/16 1245 10/21/16 1604  BP: (!) 146/79 129/79  Pulse: 72 64  Resp: 18 10  Temp: 36.7 C 36.8 C    Last Pain:  Vitals:   10/21/16 1455  TempSrc:   PainSc: 8       Patients Stated Pain Goal: 3 (58/25/18 9842)  Complications: No apparent anesthesia complications

## 2016-10-21 NOTE — Op Note (Signed)
Please see dictated report [phone]

## 2016-10-21 NOTE — Progress Notes (Signed)
Conversation with Dr. Marcie Bal regarding pt. Cannot remove gold band on L hand on ring finger. Pt. Also aware that the ring might be manually removed while he is asleep & he verbalizes that he understand.
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
## 2016-10-21 NOTE — Anesthesia Preprocedure Evaluation (Signed)
Anesthesia Evaluation  Patient identified by MRN, date of birth, ID band Patient awake    Reviewed: Allergy & Precautions, H&P , NPO status , Patient's Chart, lab work & pertinent test results  Airway Mallampati: II   Neck ROM: full    Dental   Pulmonary asthma , former smoker,    breath sounds clear to auscultation       Cardiovascular + Past MI   Rhythm:regular Rate:Normal  Stress test (2011): negative   Neuro/Psych    GI/Hepatic   Endo/Other    Renal/GU      Musculoskeletal  (+) Arthritis ,   Abdominal   Peds  Hematology   Anesthesia Other Findings   Reproductive/Obstetrics                             Anesthesia Physical Anesthesia Plan  ASA: II  Anesthesia Plan: General   Post-op Pain Management:    Induction: Intravenous  PONV Risk Score and Plan: 3 and Ondansetron, Dexamethasone, Propofol and Midazolam  Airway Management Planned: LMA  Additional Equipment:   Intra-op Plan:   Post-operative Plan:   Informed Consent: I have reviewed the patients History and Physical, chart, labs and discussed the procedure including the risks, benefits and alternatives for the proposed anesthesia with the patient or authorized representative who has indicated his/her understanding and acceptance.     Plan Discussed with: CRNA, Anesthesiologist and Surgeon  Anesthesia Plan Comments:         Anesthesia Quick Evaluation

## 2016-10-21 NOTE — Anesthesia Procedure Notes (Signed)
Procedure Name: LMA Insertion Date/Time: 10/21/2016 3:23 PM Performed by: Lance Coon Pre-anesthesia Checklist: Patient identified, Emergency Drugs available, Suction available, Patient being monitored and Timeout performed Patient Re-evaluated:Patient Re-evaluated prior to inductionOxygen Delivery Method: Circle system utilized Preoxygenation: Pre-oxygenation with 100% oxygen Intubation Type: IV induction LMA: LMA inserted LMA Size: 4.0 Number of attempts: 1 Placement Confirmation: breath sounds checked- equal and bilateral and positive ETCO2 Tube secured with: Tape Dental Injury: Teeth and Oropharynx as per pre-operative assessment

## 2016-10-22 NOTE — Anesthesia Postprocedure Evaluation (Signed)
Anesthesia Post Note  Patient: Dominic Fields  Procedure(s) Performed: Procedure(s) (LRB): CLOSED REDUCTION METACARPAL WITH PERCUTANEOUS PINNING of right small metacarpal (Right) HARDWARE REMOVAL right thumb (Right)     Patient location during evaluation: PACU Anesthesia Type: General Level of consciousness: awake and alert and patient cooperative Pain management: pain level controlled Vital Signs Assessment: post-procedure vital signs reviewed and stable Respiratory status: spontaneous breathing and respiratory function stable Cardiovascular status: stable Anesthetic complications: no    Last Vitals:  Vitals:   10/21/16 1620 10/21/16 1629  BP: (!) 143/74 (!) 142/83  Pulse: 76 79  Resp: 18 18  Temp:  36.8 C    Last Pain:  Vitals:   10/21/16 1629  TempSrc:   PainSc: 3    Pain Goal: Patients Stated Pain Goal: 3 (10/21/16 1629)               Zeig[place]

## 2016-10-24 ENCOUNTER — Encounter (HOSPITAL_COMMUNITY): Payer: Self-pay | Admitting: Orthopedic Surgery

## 2016-10-24 NOTE — Op Note (Signed)
NAME:  LEAVY, HEATHERLY                    ACCOUNT NO.:  MEDICAL RECORD NO.:  [id number]  LOCATION:                                 FACILITY:  PHYSICIAN:  Sheral Apley. Terri Rorrer, M.D.DATE OF BIRTH:  Mar 18, 1956  DATE OF PROCEDURE:  10/21/2016 DATE OF DISCHARGE:                              OPERATIVE REPORT   PREOPERATIVE DIAGNOSIS:  Right small finger carpometacarpal fracture dislocation and also painful retained hardware, right thumb proximal phalanx.  POSTOPERATIVE DIAGNOSIS:  Right small finger carpometacarpal fracture dislocation and also painful retained hardware, right thumb proximal phalanx.  PROCEDURE:  Closed reduction and percutaneous pinning, right small finger, CMC fracture dislocation with 0.062 K-wires x2, as well as hardware removal, deep right thumb.  SURGEON:  Sheral Apley. Burney Gauze, M.D.  ASSISTANT:  None.  ANESTHESIA:  General.  COMPLICATION:  None.  DRAINS:  None.  DESCRIPTION OF PROCEDURE:  The patient was taken to the operating suite after induction of adequate general anesthetic, right upper extremity was prepped and draped in usual sterile fashion.  An Esmarch was used to exsanguinate the limb.  Tourniquet was then inflated to 250 mmHg.  At this point in time, a longitudinal traction downward pressure was placed on the small metacarpal base at the [facility] joint.  Under fluoroscopic imaging, the [facility] fracture dislocation was reduced and then pinned with two 0.062 K-wires were driven from dorsal distal to volar proximal into the hamate.  The fluoroscopy unit was used to determine reduction of the fracture AP, lateral, oblique view.  The K-wires were cut outside the skin and bent upon themselves.  We then made a small oblique incision of the proximal base of the proximal phalanx of the right thumb and under fluoroscopic imaging, located a buried Kirschner wire from previous surgery many years ago, which was painful and bothersome to the patient. This was removed  with no undue difficulty.  We irrigated and loosely closed this with 4-0 nylon.  Xeroform was used to dress the wound and the pin sites.  The patient was then placed in a sterile dressing of 4x4s, fluffs, and an ulnar gutter splint.  The patient tolerated this procedure well and went to the recovery room in stable fashion.     Sheral Apley Burney Gauze, M.D.     MAW/MEDQ  D:  10/21/2016  T:  10/21/2016  Job:  [id number]

## 2016-10-28 ENCOUNTER — Ambulatory Visit (INDEPENDENT_AMBULATORY_CARE_PROVIDER_SITE_OTHER): Payer: Medicare HMO | Admitting: Orthopedic Surgery

## 2016-11-17 ENCOUNTER — Ambulatory Visit: Payer: Self-pay | Admitting: Family Medicine

## 2017-05-04 ENCOUNTER — Ambulatory Visit (INDEPENDENT_AMBULATORY_CARE_PROVIDER_SITE_OTHER): Payer: Medicare HMO | Admitting: Orthopedic Surgery

## 2017-05-11 ENCOUNTER — Ambulatory Visit (INDEPENDENT_AMBULATORY_CARE_PROVIDER_SITE_OTHER): Payer: Medicare HMO | Admitting: Orthopedic Surgery

## 2017-05-29 ENCOUNTER — Ambulatory Visit (INDEPENDENT_AMBULATORY_CARE_PROVIDER_SITE_OTHER): Payer: Medicare HMO | Admitting: Orthopedic Surgery

## 2017-06-08 ENCOUNTER — Other Ambulatory Visit (INDEPENDENT_AMBULATORY_CARE_PROVIDER_SITE_OTHER): Payer: Self-pay | Admitting: Orthopedic Surgery

## 2017-06-08 ENCOUNTER — Ambulatory Visit (INDEPENDENT_AMBULATORY_CARE_PROVIDER_SITE_OTHER): Payer: Medicare Other

## 2017-06-08 ENCOUNTER — Encounter (INDEPENDENT_AMBULATORY_CARE_PROVIDER_SITE_OTHER): Payer: Self-pay | Admitting: Orthopedic Surgery

## 2017-06-08 ENCOUNTER — Ambulatory Visit (INDEPENDENT_AMBULATORY_CARE_PROVIDER_SITE_OTHER): Payer: Self-pay

## 2017-06-08 ENCOUNTER — Ambulatory Visit (INDEPENDENT_AMBULATORY_CARE_PROVIDER_SITE_OTHER): Payer: Medicare Other | Admitting: Orthopedic Surgery

## 2017-06-08 VITALS — Ht 71.0 in | Wt 200.0 lb

## 2017-06-08 DIAGNOSIS — M79672 Pain in left foot: Secondary | ICD-10-CM

## 2017-06-08 DIAGNOSIS — M79671 Pain in right foot: Secondary | ICD-10-CM

## 2017-06-08 DIAGNOSIS — M21611 Bunion of right foot: Secondary | ICD-10-CM

## 2017-06-08 DIAGNOSIS — M7741 Metatarsalgia, right foot: Secondary | ICD-10-CM | POA: Diagnosis not present

## 2017-06-08 DIAGNOSIS — M6701 Short Achilles tendon (acquired), right ankle: Secondary | ICD-10-CM | POA: Diagnosis not present

## 2017-06-08 DIAGNOSIS — M6702 Short Achilles tendon (acquired), left ankle: Secondary | ICD-10-CM
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
## 2017-08-07 ENCOUNTER — Ambulatory Visit (INDEPENDENT_AMBULATORY_CARE_PROVIDER_SITE_OTHER): Payer: Medicare Other | Admitting: Orthopedic Surgery

## 2017-08-13 ENCOUNTER — Encounter (HOSPITAL_COMMUNITY): Payer: Self-pay | Admitting: *Deleted

## 2017-08-13 ENCOUNTER — Encounter (HOSPITAL_COMMUNITY): Payer: Self-pay | Admitting: Emergency Medicine

## 2017-08-13 ENCOUNTER — Ambulatory Visit (INDEPENDENT_AMBULATORY_CARE_PROVIDER_SITE_OTHER)
Admission: EM | Admit: 2017-08-13 | Discharge: 2017-08-13 | Disposition: A | Discharge status: Home / Self Care | Payer: Medicare HMO | Source: Home / Self Care

## 2017-08-13 ENCOUNTER — Inpatient Hospital Stay (HOSPITAL_COMMUNITY)
Admission: EM | Admit: 2017-08-13 | Discharge: 2017-08-17 | DRG: 444 | Disposition: A | Discharge status: Other Acute Inpatient Hospital | Payer: Medicare HMO | Attending: Internal Medicine | Admitting: Internal Medicine

## 2017-08-13 ENCOUNTER — Other Ambulatory Visit: Payer: Self-pay

## 2017-08-13 ENCOUNTER — Emergency Department (HOSPITAL_COMMUNITY): Payer: Medicare HMO

## 2017-08-13 DIAGNOSIS — E871 Hypo-osmolality and hyponatremia: Secondary | ICD-10-CM | POA: Diagnosis present

## 2017-08-13 DIAGNOSIS — J45909 Unspecified asthma, uncomplicated: Secondary | ICD-10-CM | POA: Diagnosis present

## 2017-08-13 DIAGNOSIS — Z79891 Long term (current) use of opiate analgesic: Secondary | ICD-10-CM

## 2017-08-13 DIAGNOSIS — R7989 Other specified abnormal findings of blood chemistry: Secondary | ICD-10-CM | POA: Diagnosis present

## 2017-08-13 DIAGNOSIS — C23 Malignant neoplasm of gallbladder: Secondary | ICD-10-CM | POA: Diagnosis present

## 2017-08-13 DIAGNOSIS — I252 Old myocardial infarction: Secondary | ICD-10-CM | POA: Diagnosis not present

## 2017-08-13 DIAGNOSIS — M549 Dorsalgia, unspecified: Secondary | ICD-10-CM | POA: Diagnosis present

## 2017-08-13 DIAGNOSIS — Z800 Family history of malignant neoplasm of digestive organs: Secondary | ICD-10-CM | POA: Diagnosis not present

## 2017-08-13 DIAGNOSIS — Z801 Family history of malignant neoplasm of trachea, bronchus and lung: Secondary | ICD-10-CM

## 2017-08-13 DIAGNOSIS — C787 Secondary malignant neoplasm of liver and intrahepatic bile duct: Secondary | ICD-10-CM | POA: Diagnosis present

## 2017-08-13 DIAGNOSIS — R1011 Right upper quadrant pain: Secondary | ICD-10-CM | POA: Diagnosis present

## 2017-08-13 DIAGNOSIS — R945 Abnormal results of liver function studies: Secondary | ICD-10-CM | POA: Diagnosis not present

## 2017-08-13 DIAGNOSIS — R160 Hepatomegaly, not elsewhere classified: Secondary | ICD-10-CM | POA: Diagnosis not present

## 2017-08-13 DIAGNOSIS — Z6829 Body mass index (BMI) 29.0-29.9, adult: Secondary | ICD-10-CM

## 2017-08-13 DIAGNOSIS — R112 Nausea with vomiting, unspecified: Secondary | ICD-10-CM

## 2017-08-13 DIAGNOSIS — L299 Pruritus, unspecified: Secondary | ICD-10-CM | POA: Diagnosis not present

## 2017-08-13 DIAGNOSIS — M4800 Spinal stenosis, site unspecified: Secondary | ICD-10-CM | POA: Diagnosis present

## 2017-08-13 DIAGNOSIS — Z87820 Personal history of traumatic brain injury: Secondary | ICD-10-CM | POA: Diagnosis not present

## 2017-08-13 DIAGNOSIS — R10811 Right upper quadrant abdominal tenderness: Secondary | ICD-10-CM

## 2017-08-13 DIAGNOSIS — F1290 Cannabis use, unspecified, uncomplicated: Secondary | ICD-10-CM | POA: Diagnosis present

## 2017-08-13 DIAGNOSIS — Z538 Procedure and treatment not carried out for other reasons: Secondary | ICD-10-CM | POA: Diagnosis not present

## 2017-08-13 DIAGNOSIS — R109 Unspecified abdominal pain: Secondary | ICD-10-CM | POA: Diagnosis not present

## 2017-08-13 DIAGNOSIS — Z87891 Personal history of nicotine dependence: Secondary | ICD-10-CM | POA: Diagnosis not present

## 2017-08-13 DIAGNOSIS — E861 Hypovolemia: Secondary | ICD-10-CM | POA: Diagnosis present

## 2017-08-13 DIAGNOSIS — E43 Unspecified severe protein-calorie malnutrition: Secondary | ICD-10-CM | POA: Diagnosis present

## 2017-08-13 DIAGNOSIS — R740 Nonspecific elevation of levels of transaminase and lactic acid dehydrogenase [LDH]: Secondary | ICD-10-CM | POA: Diagnosis present

## 2017-08-13 DIAGNOSIS — Z8711 Personal history of peptic ulcer disease: Secondary | ICD-10-CM | POA: Diagnosis not present

## 2017-08-13 DIAGNOSIS — G8929 Other chronic pain: Secondary | ICD-10-CM | POA: Diagnosis present

## 2017-08-13 DIAGNOSIS — R17 Unspecified jaundice: Secondary | ICD-10-CM

## 2017-08-13 DIAGNOSIS — R932 Abnormal findings on diagnostic imaging of liver and biliary tract: Secondary | ICD-10-CM | POA: Diagnosis not present

## 2017-08-13 DIAGNOSIS — K831 Obstruction of bile duct: Secondary | ICD-10-CM | POA: Diagnosis present

## 2017-08-13 DIAGNOSIS — Z885 Allergy status to narcotic agent status: Secondary | ICD-10-CM

## 2017-08-13 DIAGNOSIS — R001 Bradycardia, unspecified: Secondary | ICD-10-CM | POA: Diagnosis present

## 2017-08-13 DIAGNOSIS — M1990 Unspecified osteoarthritis, unspecified site: Secondary | ICD-10-CM | POA: Diagnosis present

## 2017-08-13 DIAGNOSIS — Z91041 Radiographic dye allergy status: Secondary | ICD-10-CM

## 2017-08-13 DIAGNOSIS — K838 Other specified diseases of biliary tract: Secondary | ICD-10-CM | POA: Diagnosis not present

## 2017-08-13 LAB — URINALYSIS, ROUTINE W REFLEX MICROSCOPIC
BACTERIA UA: NONE SEEN
GLUCOSE, UA: NEGATIVE mg/dL
HGB URINE DIPSTICK: NEGATIVE
KETONES UR: NEGATIVE mg/dL
Leukocytes, UA: NEGATIVE
NITRITE: NEGATIVE
PROTEIN: 30 mg/dL — AB
Specific Gravity, Urine: 1.028 (ref 1.005–1.030)
pH: 5 (ref 5.0–8.0)

## 2017-08-13 LAB — COMPREHENSIVE METABOLIC PANEL
ALBUMIN: 3.3 g/dL — AB (ref 3.5–5.0)
ALK PHOS: 453 U/L — AB (ref 38–126)
ALT: 298 U/L — ABNORMAL HIGH (ref 17–63)
ANION GAP: 10 (ref 5–15)
AST: 198 U/L — ABNORMAL HIGH (ref 15–41)
BILIRUBIN TOTAL: 9.2 mg/dL — AB (ref 0.3–1.2)
BUN: 18 mg/dL (ref 6–20)
CALCIUM: 8.8 mg/dL — AB (ref 8.9–10.3)
CO2: 24 mmol/L (ref 22–32)
Chloride: 101 mmol/L (ref 101–111)
Creatinine, Ser: 1.1 mg/dL (ref 0.61–1.24)
GFR calc non Af Amer: >60 mL/min (ref >60)
GLUCOSE: 115 mg/dL — AB (ref 65–99)
POTASSIUM: 3.4 mmol/L — AB (ref 3.5–5.1)
SODIUM: 135 mmol/L (ref 135–145)
TOTAL PROTEIN: 7.5 g/dL (ref 6.5–8.1)

## 2017-08-13 LAB — POCT I-STAT, CHEM 8
BUN: 18 mg/dL (ref 6–20)
CALCIUM ION: 1.12 mmol/L — AB (ref 1.15–1.40)
Chloride: 103 mmol/L (ref 101–111)
Creatinine, Ser: 1 mg/dL (ref 0.61–1.24)
Glucose, Bld: 175 mg/dL — ABNORMAL HIGH (ref 65–99)
HEMATOCRIT: 47 % (ref 39.0–52.0)
HEMOGLOBIN: 16 g/dL (ref 13.0–17.0)
Potassium: 3.5 mmol/L (ref 3.5–5.1)
SODIUM: 138 mmol/L (ref 135–145)
TCO2: 23 mmol/L (ref 22–32)

## 2017-08-13 LAB — CBC
HEMATOCRIT: 42.7 % (ref 39.0–52.0)
HEMOGLOBIN: 14.7 g/dL (ref 13.0–17.0)
MCH: 29.6 pg (ref 26.0–34.0)
MCHC: 34.4 g/dL (ref 30.0–36.0)
MCV: 86.1 fL (ref 78.0–100.0)
Platelets: 346 10*3/uL (ref 150–400)
RBC: 4.96 MIL/uL (ref 4.22–5.81)
RDW: 15.1 % (ref 11.5–15.5)
WBC: 6.4 10*3/uL (ref 4.0–10.5)

## 2017-08-13 LAB — POCT URINALYSIS DIP (DEVICE)
GLUCOSE, UA: NEGATIVE mg/dL
Hgb urine dipstick: NEGATIVE
Leukocytes, UA: NEGATIVE
Nitrite: NEGATIVE
PH: 7 (ref 5.0–8.0)
PROTEIN: 30 mg/dL — AB
Specific Gravity, Urine: 1.02 (ref 1.005–1.030)
UROBILINOGEN UA: 4 mg/dL — AB (ref 0.0–1.0)

## 2017-08-13 LAB — ACETAMINOPHEN LEVEL

## 2017-08-13 LAB — LIPASE, BLOOD: Lipase: 24 U/L (ref 11–51)

## 2017-08-13 MED ORDER — ONDANSETRON HCL 4 MG/2ML IJ SOLN
4.0000 mg | Freq: Once | INTRAMUSCULAR | Status: AC
  Administered 2017-08-13: 4 mg via INTRAVENOUS
  Filled 2017-08-13: qty 2

## 2017-08-13 MED ORDER — MORPHINE SULFATE (PF) 4 MG/ML IV SOLN
4.0000 mg | INTRAVENOUS | Status: DC | PRN
  Administered 2017-08-14 – 2017-08-15 (×3): 4 mg via INTRAVENOUS
  Filled 2017-08-13 (×3): qty 1

## 2017-08-13 MED ORDER — GABAPENTIN 400 MG PO CAPS
1200.0000 mg | ORAL_CAPSULE | Freq: Three times a day (TID) | ORAL | Status: DC
  Administered 2017-08-13 – 2017-08-16 (×8): 1200 mg via ORAL
  Filled 2017-08-13 (×8): qty 3

## 2017-08-13 MED ORDER — MORPHINE SULFATE (PF) 4 MG/ML IV SOLN
4.0000 mg | Freq: Once | INTRAVENOUS | Status: AC
  Administered 2017-08-13: 4 mg via INTRAVENOUS
  Filled 2017-08-13: qty 1

## 2017-08-13 MED ORDER — SODIUM CHLORIDE 0.9 % IV BOLUS
500.0000 mL | Freq: Once | INTRAVENOUS | Status: AC
  Administered 2017-08-13: 500 mL via INTRAVENOUS

## 2017-08-13 MED ORDER — OXYCODONE HCL 5 MG PO TABS
10.0000 mg | ORAL_TABLET | Freq: Two times a day (BID) | ORAL | Status: DC
  Administered 2017-08-13 – 2017-08-16 (×6): 10 mg via ORAL
  Filled 2017-08-13 (×6): qty 2

## 2017-08-13 MED ORDER — SODIUM CHLORIDE 0.9% FLUSH
3.0000 mL | Freq: Two times a day (BID) | INTRAVENOUS | Status: DC
  Administered 2017-08-13: 3 mL via INTRAVENOUS

## 2017-08-13 MED ORDER — ENOXAPARIN SODIUM 40 MG/0.4ML ~~LOC~~ SOLN
40.0000 mg | Freq: Every day | SUBCUTANEOUS | Status: DC
  Filled 2017-08-13: qty 0.4

## 2017-08-13 MED ORDER — SODIUM CHLORIDE 0.9 % IV SOLN
INTRAVENOUS | Status: AC
  Administered 2017-08-13: via INTRAVENOUS

## 2017-08-13 NOTE — ED Notes (Signed)
Patient transported to Ultrasound, [place] will bring pt to room after
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
## 2017-08-13 NOTE — Discharge Instructions (Signed)
Go directly to the emergency department

## 2017-08-13 NOTE — ED Triage Notes (Signed)
Started 1 wk ago, per pt he is having dark yellow urine, upper part of his abd pain, per pt he has a hx of kidney problems, per pt when he starts feeling this way he vomits
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
## 2017-08-13 NOTE — ED Triage Notes (Signed)
Pt to ER for evaluation of RUQ abdominal pain made worse after eating. States +nausea, +vomiting. VSS. Pt in NAD

## 2017-08-14 ENCOUNTER — Other Ambulatory Visit: Payer: Self-pay

## 2017-08-14 ENCOUNTER — Inpatient Hospital Stay (HOSPITAL_COMMUNITY): Payer: Medicare HMO

## 2017-08-14 ENCOUNTER — Encounter (HOSPITAL_COMMUNITY): Payer: Self-pay | Admitting: Radiology

## 2017-08-14 DIAGNOSIS — R945 Abnormal results of liver function studies: Secondary | ICD-10-CM

## 2017-08-14 DIAGNOSIS — L299 Pruritus, unspecified: Secondary | ICD-10-CM

## 2017-08-14 DIAGNOSIS — R160 Hepatomegaly, not elsewhere classified: Secondary | ICD-10-CM

## 2017-08-14 DIAGNOSIS — R740 Nonspecific elevation of levels of transaminase and lactic acid dehydrogenase [LDH]: Secondary | ICD-10-CM

## 2017-08-14 DIAGNOSIS — K838 Other specified diseases of biliary tract: Secondary | ICD-10-CM

## 2017-08-14 DIAGNOSIS — R109 Unspecified abdominal pain: Secondary | ICD-10-CM

## 2017-08-14 DIAGNOSIS — R1011 Right upper quadrant pain: Secondary | ICD-10-CM

## 2017-08-14 DIAGNOSIS — R932 Abnormal findings on diagnostic imaging of liver and biliary tract: Secondary | ICD-10-CM

## 2017-08-14 LAB — PROTIME-INR
INR: 0.91
PROTHROMBIN TIME: 12.1 s (ref 11.4–15.2)

## 2017-08-14 LAB — COMPREHENSIVE METABOLIC PANEL
ALT: 257 U/L — AB (ref 17–63)
AST: 188 U/L — AB (ref 15–41)
Albumin: 2.9 g/dL — ABNORMAL LOW (ref 3.5–5.0)
Alkaline Phosphatase: 431 U/L — ABNORMAL HIGH (ref 38–126)
Anion gap: 6 (ref 5–15)
BUN: 15 mg/dL (ref 6–20)
CHLORIDE: 104 mmol/L (ref 101–111)
CO2: 25 mmol/L (ref 22–32)
CREATININE: 1.05 mg/dL (ref 0.61–1.24)
Calcium: 8.5 mg/dL — ABNORMAL LOW (ref 8.9–10.3)
GFR calc Af Amer: >60 mL/min (ref >60)
Glucose, Bld: 100 mg/dL — ABNORMAL HIGH (ref 65–99)
Potassium: 4 mmol/L (ref 3.5–5.1)
Sodium: 135 mmol/L (ref 135–145)
TOTAL PROTEIN: 6.4 g/dL — AB (ref 6.5–8.1)
Total Bilirubin: 10.5 mg/dL — ABNORMAL HIGH (ref 0.3–1.2)

## 2017-08-14 LAB — CBC
HCT: 39.1 % (ref 39.0–52.0)
Hemoglobin: 13.5 g/dL (ref 13.0–17.0)
MCH: 29.9 pg (ref 26.0–34.0)
MCHC: 34.5 g/dL (ref 30.0–36.0)
MCV: 86.5 fL (ref 78.0–100.0)
PLATELETS: 283 10*3/uL (ref 150–400)
RBC: 4.52 MIL/uL (ref 4.22–5.81)
RDW: 15.3 % (ref 11.5–15.5)
WBC: 5.6 10*3/uL (ref 4.0–10.5)

## 2017-08-14 LAB — HIV ANTIBODY (ROUTINE TESTING W REFLEX): HIV SCREEN 4TH GENERATION: NONREACTIVE

## 2017-08-14 LAB — APTT: aPTT: 30 seconds (ref 24–36)

## 2017-08-14 LAB — BILIRUBIN, DIRECT: BILIRUBIN DIRECT: 5.1 mg/dL — AB (ref 0.1–0.5)

## 2017-08-14 MED ORDER — DIPHENHYDRAMINE HCL 25 MG PO CAPS
25.0000 mg | ORAL_CAPSULE | Freq: Once | ORAL | Status: AC
  Administered 2017-08-14: 25 mg via ORAL
  Filled 2017-08-14: qty 1

## 2017-08-14 MED ORDER — PIPERACILLIN-TAZOBACTAM 3.375 G IVPB: 3.3750 g | Freq: Three times a day (TID) | INTRAVENOUS | Status: DC

## 2017-08-14 MED ORDER — IOPAMIDOL (ISOVUE-300) INJECTION 61%: 100.0000 mL | Freq: Once | INTRAVENOUS | Status: DC

## 2017-08-14 MED ORDER — IOHEXOL 300 MG/ML  SOLN
100.0000 mL | Freq: Once | INTRAMUSCULAR | Status: AC | PRN
  Administered 2017-08-14: 100 mL via INTRAVENOUS

## 2017-08-14 MED ORDER — DIPHENHYDRAMINE HCL 25 MG PO CAPS
25.0000 mg | ORAL_CAPSULE | Freq: Four times a day (QID) | ORAL | Status: DC | PRN
  Administered 2017-08-14 – 2017-08-16 (×3): 25 mg via ORAL
  Filled 2017-08-14 (×3): qty 1

## 2017-08-14 MED ORDER — BOOST / RESOURCE BREEZE PO LIQD CUSTOM
1.0000 | Freq: Three times a day (TID) | ORAL | Status: DC
  Administered 2017-08-14: 1 via ORAL

## 2017-08-14 MED ORDER — PIPERACILLIN-TAZOBACTAM 3.375 G IVPB 30 MIN
3.3750 g | Freq: Once | INTRAVENOUS | Status: AC
  Administered 2017-08-14: 3.375 g via INTRAVENOUS
  Filled 2017-08-14: qty 50

## 2017-08-14 MED ORDER — GADOBENATE DIMEGLUMINE 529 MG/ML IV SOLN
18.0000 mL | Freq: Once | INTRAVENOUS | Status: AC | PRN
Start: 2017-08-14 — End: 2017-08-14
  Administered 2017-08-14: 18 mL via INTRAVENOUS

## 2017-08-14 NOTE — Progress Notes (Signed)
Pharmacy Antibiotic Note  Dominic Fields is a 62 y.o. male admitted on 08/13/2017 with liver mass/possible intra-abd infection.   dosing.  Plan: Zosyn 3.375 gm iv q8h Monitor lot     Temp (24hrs), Avg:97.8 F (36.6 C), Min:97.4 F (36.3 C), Max:98.5 F (36.9 C)  Recent Labs  Lab 08/13/17 1314 08/13/17 1441 08/14/17 0408  WBC  --  6.4 5.6  CREATININE 1.00 1.10 1.05    CrCl cannot be calculated (Unknown ideal weight.).    Allergies  Allergen Reactions  . Hydrocodone-Acetaminophen Anaphylaxis   Levester Fresh, PharmD, BCPS, BCCCP Clinical Pharmacist Clinical phone for 08/14/2017 from 7a-3:30p: [phone] If after 3:30p, please call main pharmacy at: [phone] 08/14/2017 8:19 AM

## 2017-08-14 NOTE — ED Notes (Signed)
Patient transported to CT 

## 2017-08-14 NOTE — Progress Notes (Signed)
1550 Received pt from ED. A&O x4, c/o of upper abd pain, abd is soft, tender, not distended., medicated.
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
## 2017-08-14 NOTE — ED Notes (Signed)
Attempted to call report x2

## 2017-08-15 ENCOUNTER — Inpatient Hospital Stay (HOSPITAL_COMMUNITY): Payer: Medicare HMO

## 2017-08-15 ENCOUNTER — Encounter (HOSPITAL_COMMUNITY)
Admission: EM | Disposition: A | Discharge status: Other Acute Inpatient Hospital | Payer: Self-pay | Source: Home / Self Care | Attending: Internal Medicine

## 2017-08-15 ENCOUNTER — Inpatient Hospital Stay (HOSPITAL_COMMUNITY): Payer: Medicare HMO | Admitting: Anesthesiology

## 2017-08-15 ENCOUNTER — Encounter (HOSPITAL_COMMUNITY): Payer: Self-pay | Admitting: *Deleted

## 2017-08-15 DIAGNOSIS — K831 Obstruction of bile duct: Secondary | ICD-10-CM

## 2017-08-15 LAB — CBC
HEMATOCRIT: 41.1 % (ref 39.0–52.0)
HEMOGLOBIN: 14.4 g/dL (ref 13.0–17.0)
MCH: 29.7 pg (ref 26.0–34.0)
MCHC: 35 g/dL (ref 30.0–36.0)
MCV: 84.7 fL (ref 78.0–100.0)
PLATELETS: 304 10*3/uL (ref 150–400)
RBC: 4.85 MIL/uL (ref 4.22–5.81)
RDW: 14.8 % (ref 11.5–15.5)
WBC: 5.9 10*3/uL (ref 4.0–10.5)

## 2017-08-15 LAB — COMPREHENSIVE METABOLIC PANEL
ALK PHOS: 517 U/L — AB (ref 38–126)
ALT: 231 U/L — AB (ref 17–63)
AST: 153 U/L — ABNORMAL HIGH (ref 15–41)
Albumin: 3 g/dL — ABNORMAL LOW (ref 3.5–5.0)
Anion gap: 8 (ref 5–15)
BILIRUBIN TOTAL: 15.7 mg/dL — AB (ref 0.3–1.2)
BUN: 13 mg/dL (ref 6–20)
CALCIUM: 8.7 mg/dL — AB (ref 8.9–10.3)
CO2: 21 mmol/L — ABNORMAL LOW (ref 22–32)
CREATININE: 0.99 mg/dL (ref 0.61–1.24)
Chloride: 103 mmol/L (ref 101–111)
Glucose, Bld: 142 mg/dL — ABNORMAL HIGH (ref 65–99)
Potassium: 4 mmol/L (ref 3.5–5.1)
Sodium: 132 mmol/L — ABNORMAL LOW (ref 135–145)
Total Protein: 6.8 g/dL (ref 6.5–8.1)

## 2017-08-15 LAB — HEPATITIS PANEL, ACUTE
HCV Ab: <0.1 s/co ratio (ref 0.0–0.9)
HEP A IGM: NEGATIVE
HEP B C IGM: NEGATIVE
Hepatitis B Surface Ag: NEGATIVE

## 2017-08-15 SURGERY — INVASIVE LAB ABORTED CASE
Anesthesia: General

## 2017-08-15 MED ORDER — GLUCAGON HCL RDNA (DIAGNOSTIC) 1 MG IJ SOLR
INTRAMUSCULAR | Status: AC
  Filled 2017-08-15: qty 1

## 2017-08-15 MED ORDER — IOPAMIDOL (ISOVUE-300) INJECTION 61%
INTRAVENOUS | Status: AC
  Filled 2017-08-15: qty 50

## 2017-08-15 MED ORDER — FENTANYL CITRATE (PF) 250 MCG/5ML IJ SOLN
INTRAMUSCULAR | Status: DC | PRN
  Administered 2017-08-15 (×2): 50 ug via INTRAVENOUS

## 2017-08-15 MED ORDER — DEXAMETHASONE SODIUM PHOSPHATE 10 MG/ML IJ SOLN
INTRAMUSCULAR | Status: DC | PRN
  Administered 2017-08-15: 4 mg via INTRAVENOUS

## 2017-08-15 MED ORDER — SODIUM CHLORIDE 0.9 % IV SOLN
3.0000 g | Freq: Once | INTRAVENOUS | Status: AC
  Administered 2017-08-15: 3 g via INTRAVENOUS
  Filled 2017-08-15: qty 3

## 2017-08-15 MED ORDER — ONDANSETRON HCL 4 MG/2ML IJ SOLN
INTRAMUSCULAR | Status: DC | PRN
  Administered 2017-08-15: 4 mg via INTRAVENOUS

## 2017-08-15 MED ORDER — LACTATED RINGERS IV SOLN
INTRAVENOUS | Status: AC | PRN
  Administered 2017-08-15: 11:00:00 via INTRAVENOUS
  Administered 2017-08-15: 1000 mL via INTRAVENOUS

## 2017-08-15 MED ORDER — OXYCODONE HCL 5 MG/5ML PO SOLN: 5.0000 mg | Freq: Once | ORAL | Status: DC | PRN

## 2017-08-15 MED ORDER — OXYCODONE HCL 5 MG PO TABS: 5.0000 mg | ORAL_TABLET | Freq: Once | ORAL | Status: DC | PRN

## 2017-08-15 MED ORDER — HYDROMORPHONE HCL 2 MG/ML IJ SOLN
0.5000 mg | INTRAMUSCULAR | Status: DC | PRN
  Administered 2017-08-15 – 2017-08-16 (×2): 0.5 mg via INTRAVENOUS
  Filled 2017-08-15 (×2): qty 1

## 2017-08-15 MED ORDER — LIDOCAINE 2% (20 MG/ML) 5 ML SYRINGE
INTRAMUSCULAR | Status: DC | PRN
  Administered 2017-08-15: 80 mg via INTRAVENOUS

## 2017-08-15 MED ORDER — INDOMETHACIN 50 MG RE SUPP
RECTAL | Status: AC
  Filled 2017-08-15: qty 1

## 2017-08-15 MED ORDER — HYDROMORPHONE HCL 2 MG/ML IJ SOLN: 0.5000 mg | INTRAMUSCULAR | Status: DC | PRN

## 2017-08-15 MED ORDER — PHENYLEPHRINE 40 MCG/ML (10ML) SYRINGE FOR IV PUSH (FOR BLOOD PRESSURE SUPPORT)
PREFILLED_SYRINGE | INTRAVENOUS | Status: DC | PRN
  Administered 2017-08-15 (×4): 80 ug via INTRAVENOUS
  Administered 2017-08-15 (×2): 40 ug via INTRAVENOUS

## 2017-08-15 MED ORDER — SUGAMMADEX SODIUM 200 MG/2ML IV SOLN
INTRAVENOUS | Status: DC | PRN
  Administered 2017-08-15: 200 mg via INTRAVENOUS

## 2017-08-15 MED ORDER — ONDANSETRON HCL 4 MG/2ML IJ SOLN
4.0000 mg | Freq: Four times a day (QID) | INTRAMUSCULAR | Status: DC | PRN
  Administered 2017-08-15 – 2017-08-16 (×2): 4 mg via INTRAVENOUS
  Filled 2017-08-15 (×2): qty 2

## 2017-08-15 MED ORDER — LACTATED RINGERS IV SOLN
INTRAVENOUS | Status: DC
  Administered 2017-08-15 – 2017-08-16 (×2): via INTRAVENOUS

## 2017-08-15 MED ORDER — HYDROMORPHONE HCL 2 MG/ML IJ SOLN: 0.2000 mg | INTRAMUSCULAR | Status: DC | PRN

## 2017-08-15 MED ORDER — IOPAMIDOL (ISOVUE-300) INJECTION 61%
INTRAVENOUS | Status: DC | PRN
  Administered 2017-08-15: 10 mL via INTRAVENOUS

## 2017-08-15 MED ORDER — PROPOFOL 10 MG/ML IV BOLUS
INTRAVENOUS | Status: DC | PRN
  Administered 2017-08-15: 50 mg via INTRAVENOUS
  Administered 2017-08-15: 150 mg via INTRAVENOUS

## 2017-08-15 MED ORDER — ROCURONIUM BROMIDE 10 MG/ML (PF) SYRINGE
PREFILLED_SYRINGE | INTRAVENOUS | Status: DC | PRN
  Administered 2017-08-15: 40 mg via INTRAVENOUS

## 2017-08-15 MED ORDER — PROMETHAZINE HCL 25 MG/ML IJ SOLN
6.2500 mg | INTRAMUSCULAR | Status: DC | PRN
  Administered 2017-08-15: 12.5 mg via INTRAVENOUS
  Filled 2017-08-15: qty 1

## 2017-08-15 MED ORDER — INDOMETHACIN 50 MG RE SUPP
RECTAL | Status: DC | PRN
  Administered 2017-08-15 (×2): 50 mg via RECTAL

## 2017-08-15 MED ORDER — INDOMETHACIN 50 MG RE SUPP: 100.0000 mg | Freq: Once | RECTAL | Status: DC
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
## 2017-08-15 NOTE — Transfer of Care (Signed)
Immediate Anesthesia Transfer of Care Note  Patient: Dominic Fields  Procedure(s) Performed: aborted ERCP  Patient Location: Endoscopy Unit  Anesthesia Type:General  Level of Consciousness: awake, alert  and oriented  Airway & Oxygen Therapy: Patient Spontanous Breathing and Patient connected to nasal cannula oxygen  Post-op Assessment: Report given to RN and Post -op Vital signs reviewed and stable  Post vital signs: Reviewed and stable  Last Vitals:  Vitals Value Taken Time  BP 113/49 08/15/2017 11:28 AM  Temp 36.5 C 08/15/2017 11:28 AM  Pulse 72 08/15/2017 11:30 AM  Resp 12 08/15/2017 11:30 AM  SpO2 100 % 08/15/2017 11:30 AM  Vitals shown include unvalidated device data.  Last Pain:  Vitals:   08/15/17 1128  TempSrc: Oral  PainSc:       Patients Stated Pain Goal: 8 (85/88/50 2774)  Complications: No apparent anesthesia complications

## 2017-08-15 NOTE — Op Note (Addendum)
[facility] Patient Name: Dominic Fields Procedure Date : 08/15/2017 MRN: [id number] Attending MD: Gatha Mayer , MD Date of Birth: 10/07/1955 CSN: [id number] Age: 62 Admit Type: Inpatient Procedure:                ERCP Indications:              Jaundice, Liver duct tumor Providers:                Gatha Mayer, MD, Burtis Junes, RN, Elspeth Cho                            Tech., Technician, Merrilyn Puma, CRNA Referring MD:              Medicines:                General Anesthesia Complications:            No immediate complications. Estimated Blood Loss:     Estimated blood loss: none. Procedure:                Pre-Anesthesia Assessment:                           - Prior to the procedure, a History and Physical                            was performed, and patient medications and                            allergies were reviewed. The patient's tolerance of                            previous anesthesia was also reviewed. The risks                            and benefits of the procedure and the sedation                            options and risks were discussed with the patient.                            All questions were answered, and informed consent                            was obtained. Prior Anticoagulants: The patient has                            taken no previous anticoagulant or antiplatelet                            agents. ASA Grade Assessment: II - A patient with                            mild systemic disease. After reviewing the risks  and benefits, the patient was deemed in                            satisfactory condition to undergo the procedure.                            General anestesia administered by anesthesia staff.                           After obtaining informed consent, the scope was                            passed under direct vision. Throughout the                            procedure, the patient's  blood pressure, pulse, and                            oxygen saturations were monitored continuously. The                            [id number] scope was introduced through the                            mouth, The procedure was aborted due to inability                            to cannulate The patient tolerated the procedure                            well. Scope In: Scope Out: Findings:      The scout film was normal. The major papilla was normal. The esophagus       was not seen well. Stomach was normal. Scanty bule from normal major       papilla. 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      Wire-guided cannulation attempted without success despite multiple       position changes - wire would enter pancreas some - but not far enough       to maintain position and allow dual wire maneuver to cannulate bile       duct. About 25 mins time spent w/o success - did not think I would be       successful so stopped. Contrast not injected successfully into bile duct       - transient small injection into pancrea. Impression:               - The major papilla appeared normal.                           - The ERCP was aborted due to failed cannulation.                           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                           - I have submitted a request for IR to see the                            patient                           Keeping him NPO for now in case they might be able                            to perform percutaneous procedure today - if not                            would let him eat.                           I did explain teriary center would have more                            skilled endoscopists than me and higher likelihood                            of cannulation and they are thinking about possible                            transfer - though the hilar tumor  may still require                            percutaneous approach to provide relief of                            obstruction - so I think going to percutaneous                            route makes most sense at this time but they are                            considering tertiary transfer. Procedure Code(s):        --- Professional ---                           [id number], 53, Endoscopic retrograde                            cholangiopancreatography (ERCP); diagnostic,                            including collection of specimen(s) by brushing or                            washing, when performed (separate procedure) Diagnosis Code(s):        --- Professional ---  Z53.8, Procedure and treatment not carried out for                            other reasons                           R17, Unspecified jaundice                           D49.0, Neoplasm of unspecified behavior of                            digestive system CPT copyright 2017 American Medical Association. All rights reserved. The codes documented in this report are preliminary and upon coder review may  be revised to meet current compliance requirements. Gatha Mayer, MD 08/15/2017 11:37:19 AM This report has been signed electronically. Number of Addenda: 0

## 2017-08-15 NOTE — Anesthesia Procedure Notes (Signed)
Procedure Name: Intubation Date/Time: 08/15/2017 10:27 AM Performed by: Wilburn Cornelia, CRNA Pre-anesthesia Checklist: Patient identified, Emergency Drugs available, Suction available, Patient being monitored and Timeout performed Patient Re-evaluated:Patient Re-evaluated prior to induction Oxygen Delivery Method: Circle system utilized Preoxygenation: Pre-oxygenation with 100% oxygen Induction Type: IV induction Ventilation: Mask ventilation with difficulty Laryngoscope Size: Mac and 4 Grade View: Grade I Tube type: Oral Tube size: 7.5 mm Number of attempts: 1 Airway Equipment and Method: Stylet Placement Confirmation: ETT inserted through vocal cords under direct vision,  positive ETCO2,  CO2 detector and breath sounds checked- equal and bilateral Secured at: 23 cm Tube secured with: Tape Dental Injury: Teeth and Oropharynx as per pre-operative assessment

## 2017-08-15 NOTE — Anesthesia Postprocedure Evaluation (Signed)
Anesthesia Post Note  Patient: Dominic Fields  Procedure(s) Performed: aborted ERCP     Patient location during evaluation: PACU Anesthesia Type: General Level of consciousness: awake and alert Pain management: pain level controlled Vital Signs Assessment: post-procedure vital signs reviewed and stable Respiratory status: spontaneous breathing, nonlabored ventilation and respiratory function stable Cardiovascular status: blood pressure returned to baseline and stable Postop Assessment: no apparent nausea or vomiting Anesthetic complications: no    Last Vitals:  Vitals:   08/15/17 1210 08/15/17 1211  BP: (!) 148/75   Pulse: 61 61  Resp: 17 17  Temp:    SpO2: 100% 99%    Last Pain:  Vitals:   08/15/17 1232  TempSrc:   PainSc: [place]

## 2017-08-15 NOTE — Anesthesia Preprocedure Evaluation (Signed)
Anesthesia Evaluation  Patient identified by MRN, date of birth, ID band Patient awake    Reviewed: Allergy & Precautions, H&P , NPO status , Patient's Chart, lab work & pertinent test results  Airway Mallampati: II   Neck ROM: full    Dental   Pulmonary asthma , former smoker,    breath sounds clear to auscultation       Cardiovascular + Past MI   Rhythm:regular Rate:Normal  Stress test (2011): negative   Neuro/Psych    GI/Hepatic   Endo/Other    Renal/GU      Musculoskeletal  (+) Arthritis ,   Abdominal   Peds  Hematology   Anesthesia Other Findings   Reproductive/Obstetrics                             Anesthesia Physical  Anesthesia Plan  ASA: II  Anesthesia Plan: General   Post-op Pain Management:    Induction: Intravenous  PONV Risk Score and Plan: 2 and Treatment may vary due to age or medical condition  Airway Management Planned: Oral ETT  Additional Equipment:   Intra-op Plan:   Post-operative Plan: Extubation in OR  Informed Consent: I have reviewed the patients History and Physical, chart, labs and discussed the procedure including the risks, benefits and alternatives for the proposed anesthesia with the patient or authorized representative who has indicated his/her understanding and acceptance.   Dental advisory given  Plan Discussed with: CRNA, Anesthesiologist and Surgeon  Anesthesia Plan Comments:         Anesthesia Quick Evaluation

## 2017-08-16 MED ORDER — HYDROMORPHONE HCL 2 MG/ML IJ SOLN
1.0000 mg | INTRAMUSCULAR | Status: DC | PRN
  Administered 2017-08-16 – 2017-08-17 (×2): 1 mg via INTRAVENOUS
  Filled 2017-08-16 (×2): qty 1

## 2017-08-16 MED ORDER — HYDROMORPHONE HCL 2 MG/ML IJ SOLN
1.0000 mg | INTRAMUSCULAR | Status: DC | PRN
  Administered 2017-08-16: 1 mg via INTRAVENOUS
  Filled 2017-08-16: qty 1

## 2017-08-16 MED ORDER — SODIUM CHLORIDE 0.9 % IV SOLN: 1.0000 mg/h | INTRAVENOUS | Status: DC | PRN

## 2017-08-16 MED ORDER — PROCHLORPERAZINE EDISYLATE 10 MG/2ML IJ SOLN
10.0000 mg | INTRAMUSCULAR | Status: AC
  Administered 2017-08-16: 10 mg via INTRAVENOUS
  Filled 2017-08-16: qty 2
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
## 2017-08-16 NOTE — Progress Notes (Signed)
   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 He and wife have decided to go to [facility] - more skilled biliary endoscopists there so hopefully can do ERCP approach   Appreciate IR help/willingness here  Signing off  Gatha Mayer, MD, [facility] 08/16/2017 8:55 AM

## 2017-08-16 NOTE — Plan of Care (Signed)
  Problem: Education: Goal: Knowledge of General Education information will improve Outcome: Progressing Note:  POC reviewed with pt./wife; pt. to transfer to another hospital and both aware.

## 2017-08-17 ENCOUNTER — Ambulatory Visit (INDEPENDENT_AMBULATORY_CARE_PROVIDER_SITE_OTHER): Payer: Medicare Other | Admitting: Orthopedic Surgery

## 2017-08-17 NOTE — Progress Notes (Signed)
Pt. transported via stretcher by [place] to vehicle for transport; wife and sister along side of pt.; pt. alert and oriented x4 and agreeable to transport. No problems noted.

## 2017-08-17 NOTE — Progress Notes (Signed)
TC from [place] has bed for pt.; [address] Dr. Fuller Plan informed and [place] form needs to be completed. Wife/pt. and sister informed that [facility] has bed for pt.; report called to [place] at [facility] 08/17/17 0042.
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
## 2017-08-17 NOTE — Plan of Care (Signed)
  Problem: Nutrition: Goal: Adequate nutrition will be maintained Outcome: Not Progressing Note:  Pt. nauseated and NPO. Pt. Transferred to [facility].

## 2017-08-24 ENCOUNTER — Encounter (HOSPITAL_COMMUNITY): Payer: Self-pay | Admitting: Emergency Medicine

## 2017-08-24 ENCOUNTER — Ambulatory Visit (HOSPITAL_COMMUNITY)
Admission: EM | Admit: 2017-08-24 | Discharge: 2017-08-24 | Disposition: A | Discharge status: Home / Self Care | Payer: Medicare HMO | Attending: Family Medicine | Admitting: Family Medicine

## 2017-08-24 DIAGNOSIS — R1011 Right upper quadrant pain: Secondary | ICD-10-CM

## 2017-08-24 DIAGNOSIS — K828 Other specified diseases of gallbladder: Secondary | ICD-10-CM

## 2017-08-24 DIAGNOSIS — R160 Hepatomegaly, not elsewhere classified: Secondary | ICD-10-CM

## 2017-08-24 NOTE — ED Triage Notes (Signed)
PT reports RUQ pain. PT has known gallbladder problems. PT was seen here 10 days ago and was sent to the ER. [facility], was transferred to [facility] and discharged 3 days later. PT was diagnosed with cancer affected gallbladder and liver. PT complaining of pain today. No nausea.

## 2017-08-24 NOTE — Discharge Instructions (Signed)
You may take the pain medicine with the other medicines It is safe to take the ciprofloxacin with the flomax and the gabapentin and the oxycodone The oxycodone can cause constipation If you fail to urinate AND have lowe abdominal fullness and pain, you will need to go to the ER  Follow up is May 20 7;45 am [place]
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
## 2017-09-10 ENCOUNTER — Emergency Department (HOSPITAL_COMMUNITY)
Admission: EM | Admit: 2017-09-10 | Discharge: 2017-09-10 | Disposition: A | Discharge status: Home / Self Care | Payer: Medicare HMO | Attending: Emergency Medicine | Admitting: Emergency Medicine

## 2017-09-10 ENCOUNTER — Emergency Department (HOSPITAL_COMMUNITY): Payer: Medicare HMO

## 2017-09-10 ENCOUNTER — Encounter (HOSPITAL_COMMUNITY): Payer: Self-pay | Admitting: Emergency Medicine

## 2017-09-10 DIAGNOSIS — R079 Chest pain, unspecified: Secondary | ICD-10-CM | POA: Diagnosis present

## 2017-09-10 DIAGNOSIS — Z87891 Personal history of nicotine dependence: Secondary | ICD-10-CM | POA: Diagnosis not present

## 2017-09-10 DIAGNOSIS — J45909 Unspecified asthma, uncomplicated: Secondary | ICD-10-CM | POA: Diagnosis not present

## 2017-09-10 DIAGNOSIS — R1013 Epigastric pain: Secondary | ICD-10-CM | POA: Insufficient documentation

## 2017-09-10 DIAGNOSIS — Z79899 Other long term (current) drug therapy: Secondary | ICD-10-CM | POA: Diagnosis not present

## 2017-09-10 DIAGNOSIS — I252 Old myocardial infarction: Secondary | ICD-10-CM | POA: Diagnosis not present

## 2017-09-10 LAB — CBC
HCT: 37.9 % — ABNORMAL LOW (ref 39.0–52.0)
Hemoglobin: 13 g/dL (ref 13.0–17.0)
MCH: 29.8 pg (ref 26.0–34.0)
MCHC: 34.3 g/dL (ref 30.0–36.0)
MCV: 86.9 fL (ref 78.0–100.0)
PLATELETS: 204 10*3/uL (ref 150–400)
RBC: 4.36 MIL/uL (ref 4.22–5.81)
RDW: 15.6 % — ABNORMAL HIGH (ref 11.5–15.5)
WBC: 8.8 10*3/uL (ref 4.0–10.5)

## 2017-09-10 LAB — I-STAT TROPONIN, ED: Troponin i, poc: 0 ng/mL (ref 0.00–0.08)

## 2017-09-10 LAB — BASIC METABOLIC PANEL
Anion gap: 10 (ref 5–15)
BUN: 19 mg/dL (ref 6–20)
CO2: 21 mmol/L — AB (ref 22–32)
CREATININE: 0.89 mg/dL (ref 0.61–1.24)
Calcium: 8.3 mg/dL — ABNORMAL LOW (ref 8.9–10.3)
Chloride: 99 mmol/L — ABNORMAL LOW (ref 101–111)
GFR calc non Af Amer: >60 mL/min (ref >60)
Glucose, Bld: 174 mg/dL — ABNORMAL HIGH (ref 65–99)
Potassium: 3.1 mmol/L — ABNORMAL LOW (ref 3.5–5.1)
Sodium: 130 mmol/L — ABNORMAL LOW (ref 135–145)

## 2017-09-10 LAB — HEPATIC FUNCTION PANEL
ALK PHOS: 236 U/L — AB (ref 38–126)
ALT: 57 U/L (ref 17–63)
AST: 46 U/L — ABNORMAL HIGH (ref 15–41)
Albumin: 3.1 g/dL — ABNORMAL LOW (ref 3.5–5.0)
BILIRUBIN INDIRECT: 1.3 mg/dL — AB (ref 0.3–0.9)
BILIRUBIN TOTAL: 2.6 mg/dL — AB (ref 0.3–1.2)
Bilirubin, Direct: 1.3 mg/dL — ABNORMAL HIGH (ref 0.1–0.5)
Total Protein: 7.2 g/dL (ref 6.5–8.1)

## 2017-09-10 LAB — LIPASE, BLOOD: LIPASE: 17 U/L (ref 11–51)

## 2017-09-10 MED ORDER — HYDROMORPHONE HCL 1 MG/ML IJ SOLN
1.0000 mg | Freq: Once | INTRAMUSCULAR | Status: AC
  Administered 2017-09-10: 1 mg via INTRAVENOUS
  Filled 2017-09-10: qty 1

## 2017-09-10 MED ORDER — ONDANSETRON HCL 4 MG/2ML IJ SOLN
4.0000 mg | Freq: Once | INTRAMUSCULAR | Status: AC
  Administered 2017-09-10: 4 mg via INTRAVENOUS
  Filled 2017-09-10: qty 2

## 2017-09-10 MED ORDER — FAMOTIDINE 20 MG PO TABS: 20.0000 mg | ORAL_TABLET | Freq: Two times a day (BID) | ORAL | 0 refills | Status: AC

## 2017-09-10 MED ORDER — SODIUM CHLORIDE 0.9 % IV BOLUS
1000.0000 mL | Freq: Once | INTRAVENOUS | Status: AC
  Administered 2017-09-10: 1000 mL via INTRAVENOUS

## 2017-09-10 MED ORDER — LORAZEPAM 1 MG PO TABS: 1.0000 mg | ORAL_TABLET | Freq: Every evening | ORAL | 0 refills | Status: AC | PRN

## 2017-09-10 MED ORDER — HYDROMORPHONE HCL 2 MG/ML IJ SOLN
2.0000 mg | Freq: Once | INTRAMUSCULAR | Status: AC
  Administered 2017-09-10: 2 mg via INTRAVENOUS
  Filled 2017-09-10: qty 1

## 2017-09-10 MED ORDER — ONDANSETRON 4 MG PO TBDP: 4.0000 mg | ORAL_TABLET | Freq: Three times a day (TID) | ORAL | 0 refills | Status: AC | PRN

## 2017-09-10 MED ORDER — GI COCKTAIL ~~LOC~~
30.0000 mL | Freq: Once | ORAL | Status: AC
  Administered 2017-09-10: 30 mL via ORAL
  Filled 2017-09-10: qty 30

## 2017-09-10 MED ORDER — FAMOTIDINE IN NACL 20-0.9 MG/50ML-% IV SOLN
20.0000 mg | Freq: Two times a day (BID) | INTRAVENOUS | Status: DC
  Administered 2017-09-10: 20 mg via INTRAVENOUS
  Filled 2017-09-10: qty 50
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
## 2017-09-10 NOTE — ED Notes (Signed)
Patient transported to X-ray 

## 2017-09-10 NOTE — ED Triage Notes (Addendum)
Pt having chest pains, belching, n/v that started last night.  Pt has mass on gallbladder and liver and starts treatments this week.  Reports that pain is worse with breathing, reports that dont breath deep any ways.

## 2017-09-10 NOTE — ED Notes (Signed)
Patient transported to CT 

## 2017-11-10 ENCOUNTER — Encounter (HOSPITAL_COMMUNITY): Payer: Self-pay

## 2017-11-10 ENCOUNTER — Other Ambulatory Visit: Payer: Self-pay

## 2017-11-10 ENCOUNTER — Emergency Department (HOSPITAL_COMMUNITY)
Admission: EM | Admit: 2017-11-10 | Discharge: 2017-11-11 | Disposition: A | Discharge status: Home / Self Care | Payer: Medicare HMO | Attending: Emergency Medicine | Admitting: Emergency Medicine

## 2017-11-10 ENCOUNTER — Emergency Department (HOSPITAL_COMMUNITY): Payer: Medicare HMO

## 2017-11-10 DIAGNOSIS — Z859 Personal history of malignant neoplasm, unspecified: Secondary | ICD-10-CM | POA: Insufficient documentation

## 2017-11-10 DIAGNOSIS — D649 Anemia, unspecified: Secondary | ICD-10-CM | POA: Diagnosis not present

## 2017-11-10 DIAGNOSIS — Z79899 Other long term (current) drug therapy: Secondary | ICD-10-CM | POA: Diagnosis not present

## 2017-11-10 DIAGNOSIS — Z7901 Long term (current) use of anticoagulants: Secondary | ICD-10-CM | POA: Insufficient documentation

## 2017-11-10 DIAGNOSIS — Z87891 Personal history of nicotine dependence: Secondary | ICD-10-CM | POA: Insufficient documentation

## 2017-11-10 DIAGNOSIS — J45909 Unspecified asthma, uncomplicated: Secondary | ICD-10-CM | POA: Insufficient documentation

## 2017-11-10 DIAGNOSIS — I252 Old myocardial infarction: Secondary | ICD-10-CM | POA: Insufficient documentation

## 2017-11-10 HISTORY — DX: Malignant (primary) neoplasm, unspecified: C80.1

## 2017-11-10 LAB — COMPREHENSIVE METABOLIC PANEL
ALBUMIN: 1.9 g/dL — AB (ref 3.5–5.0)
ALT: 15 U/L (ref 0–44)
ANION GAP: 4 — AB (ref 5–15)
AST: 23 U/L (ref 15–41)
Alkaline Phosphatase: 96 U/L (ref 38–126)
BUN: 17 mg/dL (ref 8–23)
CHLORIDE: 105 mmol/L (ref 98–111)
CO2: 26 mmol/L (ref 22–32)
Calcium: 7.9 mg/dL — ABNORMAL LOW (ref 8.9–10.3)
Creatinine, Ser: 1.28 mg/dL — ABNORMAL HIGH (ref 0.61–1.24)
GFR calc Af Amer: >60 mL/min (ref >60)
GFR calc non Af Amer: 59 mL/min — ABNORMAL LOW (ref >60)
Glucose, Bld: 136 mg/dL — ABNORMAL HIGH (ref 70–99)
POTASSIUM: 3.6 mmol/L (ref 3.5–5.1)
SODIUM: 135 mmol/L (ref 135–145)
Total Bilirubin: 0.3 mg/dL (ref 0.3–1.2)
Total Protein: 6.2 g/dL — ABNORMAL LOW (ref 6.5–8.1)

## 2017-11-10 LAB — CBC WITH DIFFERENTIAL/PLATELET
BASOS ABS: 0 10*3/uL (ref 0.0–0.1)
Basophils Relative: 1 %
Eosinophils Absolute: 0.1 10*3/uL (ref 0.0–0.7)
Eosinophils Relative: 2 %
HEMATOCRIT: 24 % — AB (ref 39.0–52.0)
Hemoglobin: 7.5 g/dL — ABNORMAL LOW (ref 13.0–17.0)
LYMPHS ABS: 1.5 10*3/uL (ref 0.7–4.0)
LYMPHS PCT: 32 %
MCH: 27.4 pg (ref 26.0–34.0)
MCHC: 31.3 g/dL (ref 30.0–36.0)
MCV: 87.6 fL (ref 78.0–100.0)
Monocytes Absolute: 0.4 10*3/uL (ref 0.1–1.0)
Monocytes Relative: 10 %
NEUTROS ABS: 2.5 10*3/uL (ref 1.7–7.7)
Neutrophils Relative %: 55 %
Platelets: 430 10*3/uL — ABNORMAL HIGH (ref 150–400)
RBC: 2.74 MIL/uL — ABNORMAL LOW (ref 4.22–5.81)
RDW: 19.3 % — AB (ref 11.5–15.5)
WBC: 4.5 10*3/uL (ref 4.0–10.5)

## 2017-11-10 LAB — ABO/RH: ABO/RH(D): O POS

## 2017-11-10 LAB — PREPARE RBC (CROSSMATCH)

## 2017-11-10 MED ORDER — LORAZEPAM 2 MG/ML IJ SOLN: 1.0000 mg | Freq: Once | INTRAMUSCULAR | Status: DC

## 2017-11-10 MED ORDER — SODIUM CHLORIDE 0.9% IV SOLUTION
Freq: Once | INTRAVENOUS | Status: AC
  Administered 2017-11-10: 18:00:00 via INTRAVENOUS

## 2017-11-10 NOTE — ED Triage Notes (Signed)
Pt arrives via POV from home. Pt is actively receiving chemo for gallbladder and liver cancer. Pt had blood work yesterday and pt was notified by PCP that hemoglobin was 7.6, Sodium 126 and creatinine 1.9. Pt was advised to come to ED. Pt has PICC line in place and is currently getting abx infusion for staph infection.
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
## 2017-11-11 NOTE — ED Notes (Signed)
discharged instructions reviewed with pt. Pt verbalized understanding. Pt to follow up with oncology. PIV removed. Pt assisted to waiting room via wheelchair.

## 2017-11-13 LAB — BPAM RBC
Blood Product Expiration Date: 201909012359
ISSUE DATE / TIME: 201907261938
Unit Type and Rh: 5100

## 2017-11-13 LAB — TYPE AND SCREEN
ABO/RH(D): O POS
Antibody Screen: NEGATIVE
UNIT DIVISION: 0

## 2018-03-13 ENCOUNTER — Emergency Department (HOSPITAL_COMMUNITY)
Admission: EM | Admit: 2018-03-13 | Discharge: 2018-03-13 | Disposition: A | Discharge status: Home / Self Care | Payer: Medicare HMO | Attending: Emergency Medicine | Admitting: Emergency Medicine

## 2018-03-13 ENCOUNTER — Other Ambulatory Visit: Payer: Self-pay

## 2018-03-13 ENCOUNTER — Encounter (HOSPITAL_COMMUNITY): Payer: Self-pay

## 2018-03-13 DIAGNOSIS — F1290 Cannabis use, unspecified, uncomplicated: Secondary | ICD-10-CM | POA: Diagnosis not present

## 2018-03-13 DIAGNOSIS — Z87891 Personal history of nicotine dependence: Secondary | ICD-10-CM | POA: Insufficient documentation

## 2018-03-13 DIAGNOSIS — Z79899 Other long term (current) drug therapy: Secondary | ICD-10-CM | POA: Insufficient documentation

## 2018-03-13 DIAGNOSIS — E871 Hypo-osmolality and hyponatremia: Secondary | ICD-10-CM | POA: Insufficient documentation

## 2018-03-13 DIAGNOSIS — Z8509 Personal history of malignant neoplasm of other digestive organs: Secondary | ICD-10-CM | POA: Insufficient documentation

## 2018-03-13 DIAGNOSIS — R10811 Right upper quadrant abdominal tenderness: Secondary | ICD-10-CM | POA: Diagnosis not present

## 2018-03-13 LAB — COMPREHENSIVE METABOLIC PANEL
ALBUMIN: 2.9 g/dL — AB (ref 3.5–5.0)
ALT: 13 U/L (ref 0–44)
ANION GAP: 10 (ref 5–15)
AST: 23 U/L (ref 15–41)
Alkaline Phosphatase: 300 U/L — ABNORMAL HIGH (ref 38–126)
BUN: 12 mg/dL (ref 8–23)
CHLORIDE: 97 mmol/L — AB (ref 98–111)
CO2: 20 mmol/L — ABNORMAL LOW (ref 22–32)
Calcium: 8.5 mg/dL — ABNORMAL LOW (ref 8.9–10.3)
Creatinine, Ser: 0.84 mg/dL (ref 0.61–1.24)
GFR calc Af Amer: >60 mL/min (ref >60)
GFR calc non Af Amer: >60 mL/min (ref >60)
GLUCOSE: 113 mg/dL — AB (ref 70–99)
POTASSIUM: 4.1 mmol/L (ref 3.5–5.1)
SODIUM: 127 mmol/L — AB (ref 135–145)
TOTAL PROTEIN: 7.7 g/dL (ref 6.5–8.1)
Total Bilirubin: 0.6 mg/dL (ref 0.3–1.2)

## 2018-03-13 LAB — CBC
HEMATOCRIT: 29.5 % — AB (ref 39.0–52.0)
HEMOGLOBIN: 9.6 g/dL — AB (ref 13.0–17.0)
MCH: 28.7 pg (ref 26.0–34.0)
MCHC: 32.5 g/dL (ref 30.0–36.0)
MCV: 88.3 fL (ref 80.0–100.0)
Platelets: 367 10*3/uL (ref 150–400)
RBC: 3.34 MIL/uL — AB (ref 4.22–5.81)
RDW: 16.2 % — ABNORMAL HIGH (ref 11.5–15.5)
WBC: 5.9 10*3/uL (ref 4.0–10.5)
nRBC: 0 % (ref 0.0–0.2)

## 2018-03-13 LAB — LIPASE, BLOOD: LIPASE: 42 U/L (ref 11–51)

## 2018-03-13 NOTE — Discharge Instructions (Signed)
Please read and follow all provided instructions.  Your diagnoses today include:  1. Hyponatremia     Tests performed today include:  Blood counts and electrolytes - your sodium today was 127 and your hemoglobin was 9.6  Vital signs. See below for your results today.   Medications prescribed:   None  Take any prescribed medications only as directed.  Home care instructions:  Follow any educational materials contained in this packet.  Follow-up instructions: Please follow-up with your doctor tomorrow regarding recheck of your sodium.  You should ask them what additional treatments they would like you to start, if any, for your low sodium levels.  Return instructions:   Please return to the Emergency Department if you experience worsening symptoms.   Return if you develop worsening confusion, trouble walking or trouble with your balance, nausea or vomiting.  Please return if you have any other emergent concerns.  Additional Information:  Your vital signs today were: BP 104/71    Pulse 81    Temp 97.7 F (36.5 C) (Oral)    Resp 14    Ht 5\' 10"  (1.778 m)    Wt 73.5 kg    SpO2 98%    BMI 23.24 kg/m  If your blood pressure (BP) was elevated above 135/85 this visit, please have this repeated by your doctor within one month. --------------

## 2018-03-13 NOTE — ED Triage Notes (Signed)
Pt reports a sodium of 124 from blood work drawn yesterday. Denies any new complaints or new pain. Reports chronic abdominal pain from a mass. A&Ox4.
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
## 2018-08-17 DEATH — deceased

## 2019-01-01 IMAGING — CR DG CHEST 2V
2 series · 2 of 2 positions shown · non-contrast
Comparison: Radiographs November 15, 2009.

CLINICAL DATA: Chest pain, shortness of breath.

EXAM:
CHEST - 2 VIEW

[w chest lat]
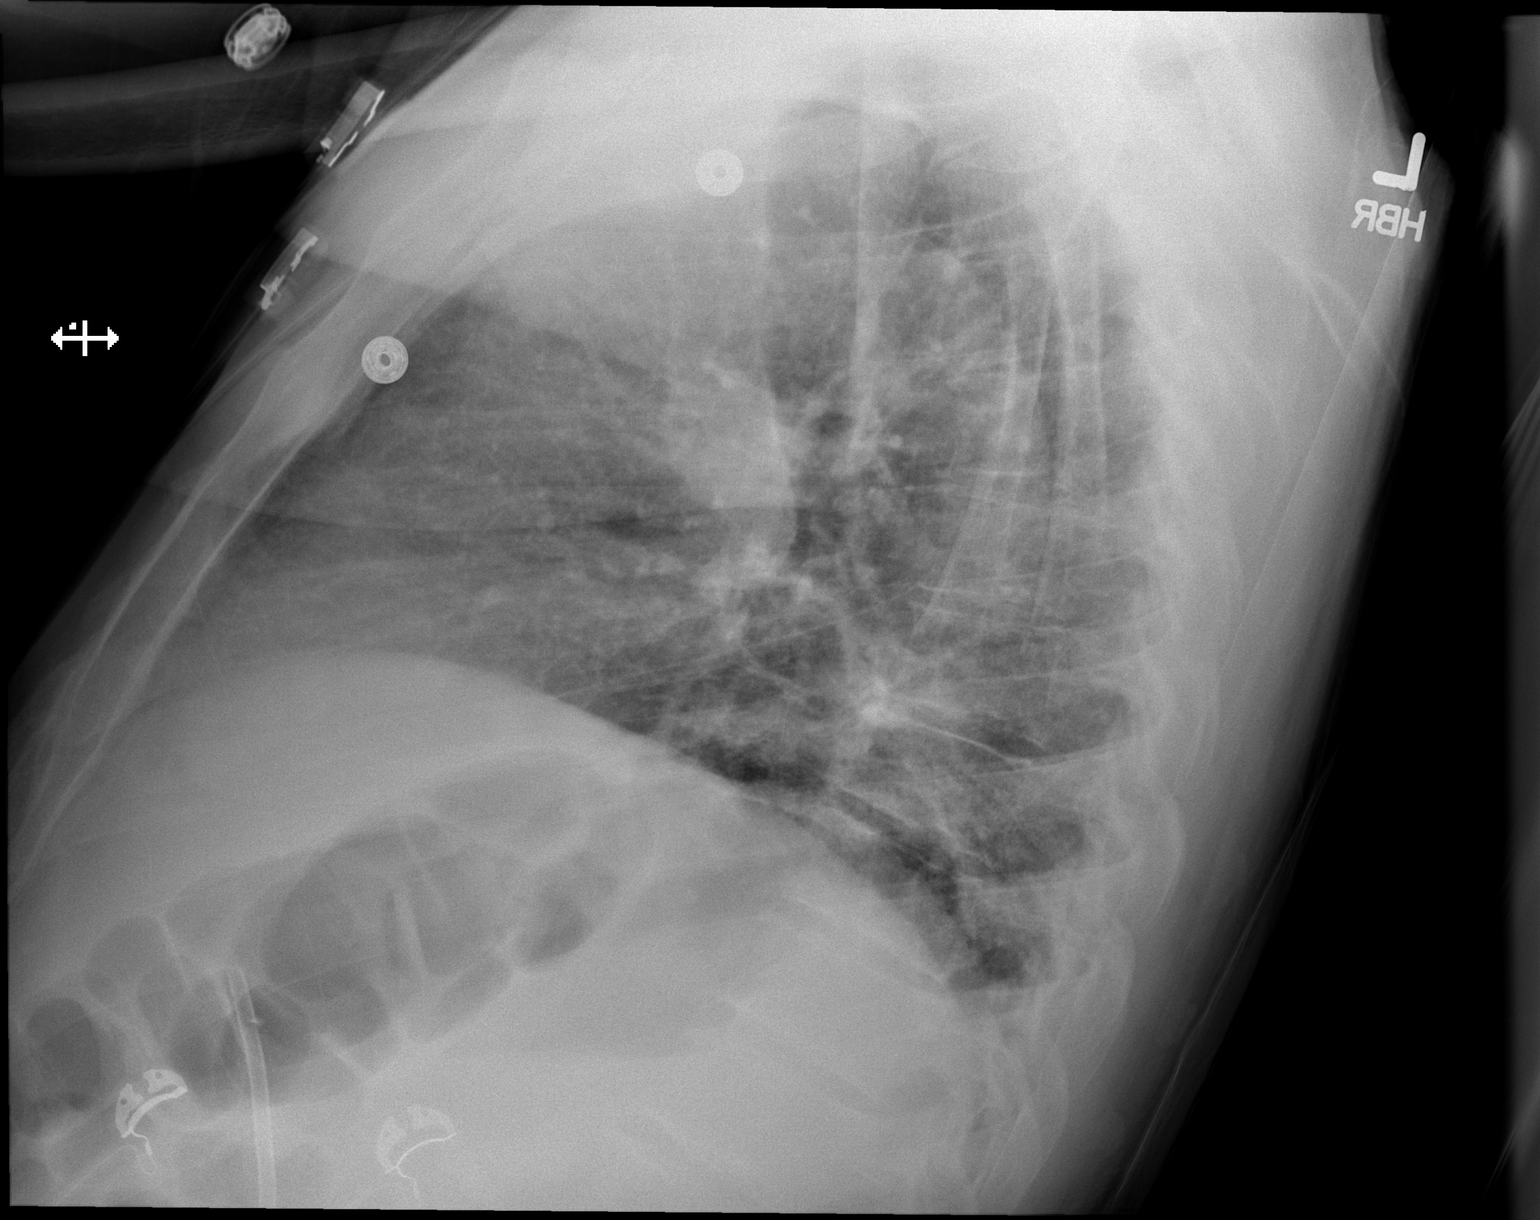

[x chest ap]
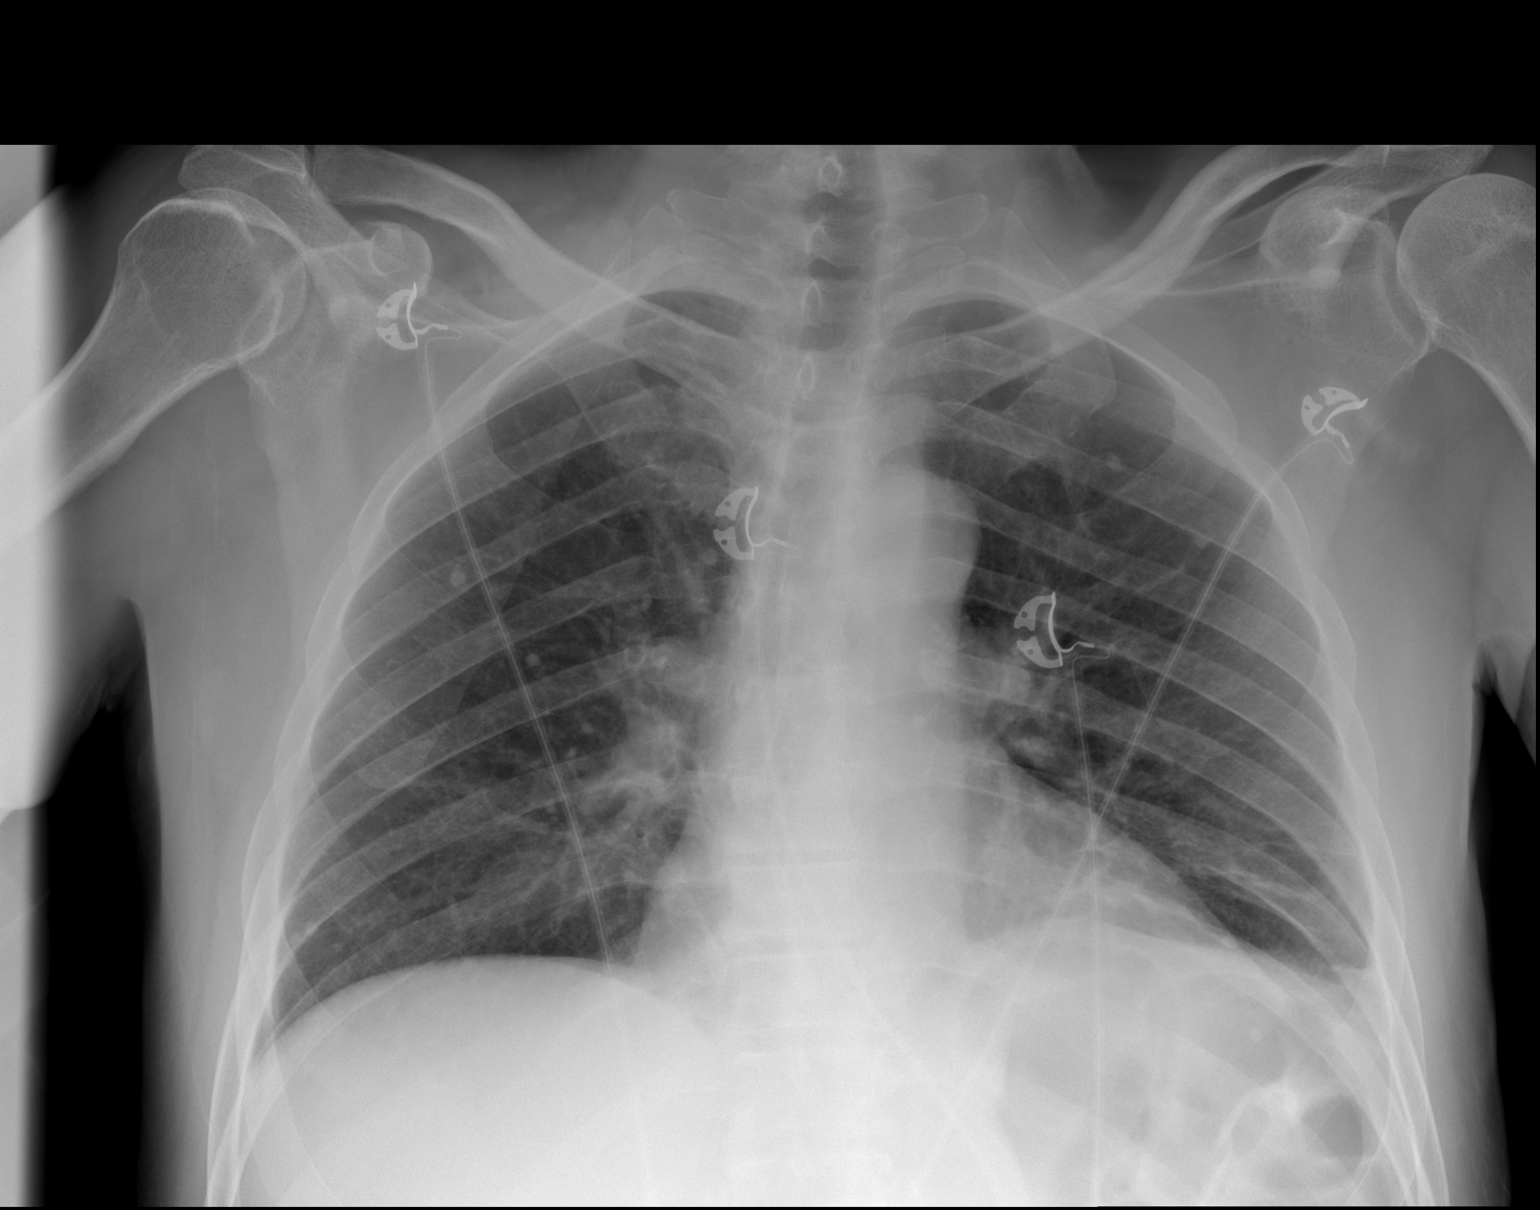

[2 of 2 positions shown; findings below may reference images not displayed]

FINDINGS: The heart size and mediastinal contours are within normal limits. No
pneumothorax is noted. Stable bilateral calcified granulomata are
noted. Mild left basilar subsegmental atelectasis with minimal
pleural effusion is noted. The visualized skeletal structures are
unremarkable.
IMPRESSION: Mild left basilar subsegmental atelectasis with minimal pleural
effusion.

## 2019-02-11 IMAGING — US US ABDOMEN LIMITED
1 series · 14 of 25 positions shown · non-contrast
Comparison: CT abdomen pelvis, 02/01/2005

CLINICAL DATA: Right upper quadrant abdominal pain for 1 week.

EXAM:
ULTRASOUND ABDOMEN LIMITED RIGHT UPPER QUADRANT

[Series 1: us abdomen limited · 0.23mm/px · 14 of 35 slices shown]
[im 1/35]
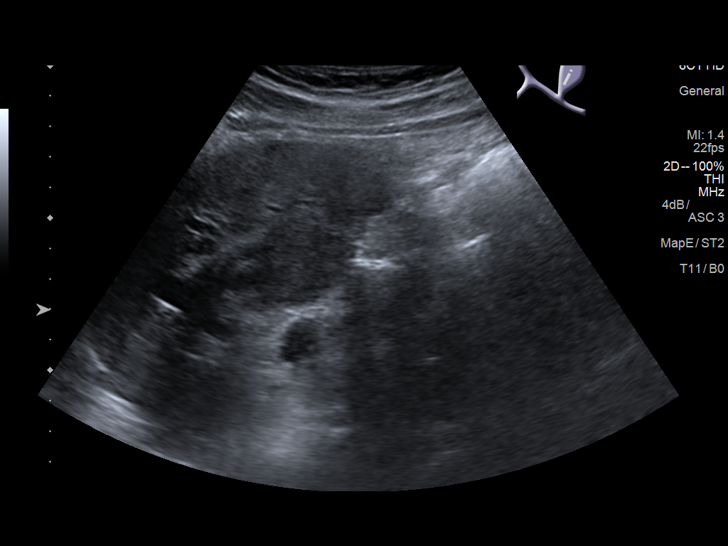
[im 3/35]
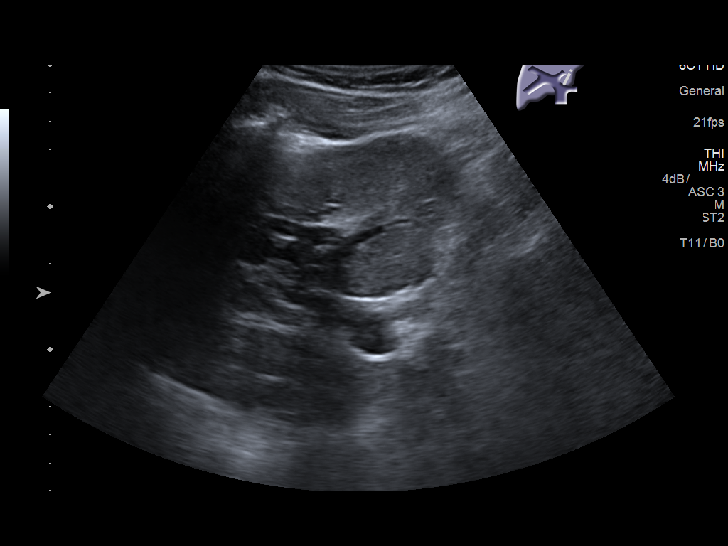
[im 6/35]
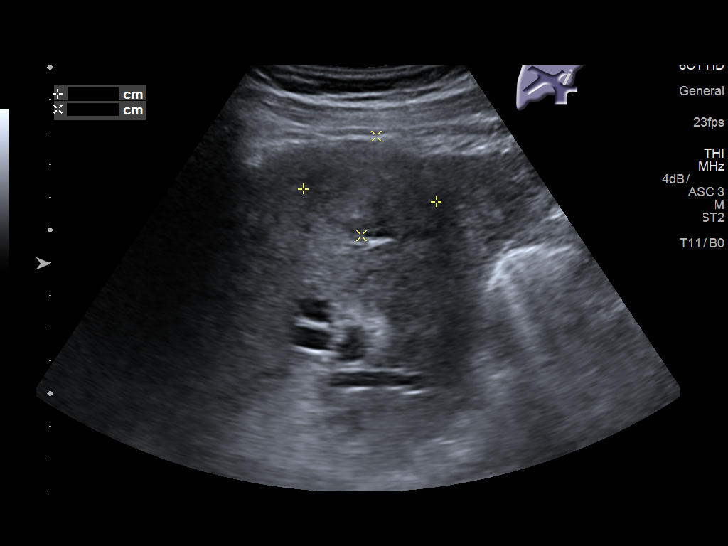
[im 9/35]
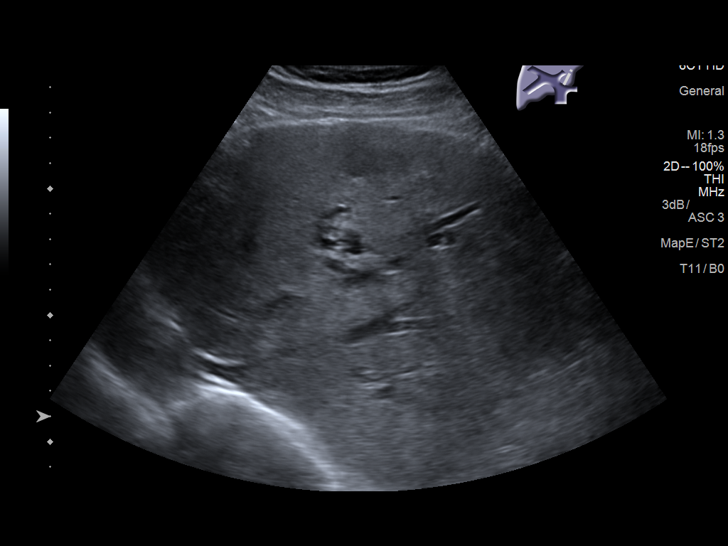
[im 12/35]
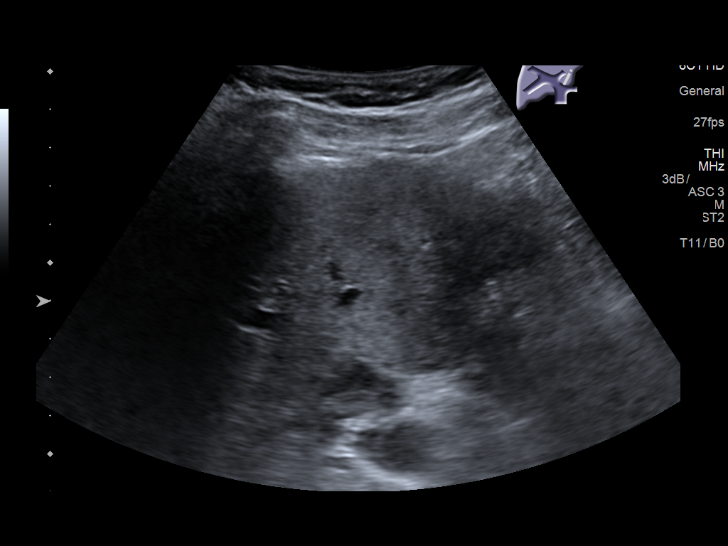
[im 13/35]
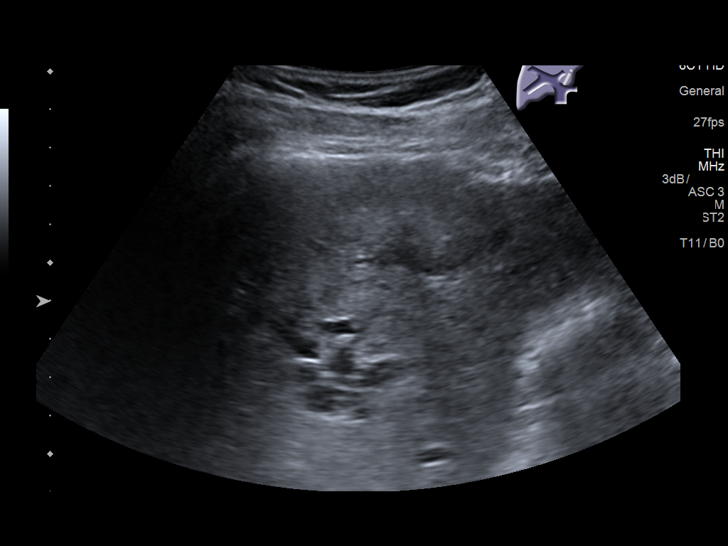
[im 16/35]
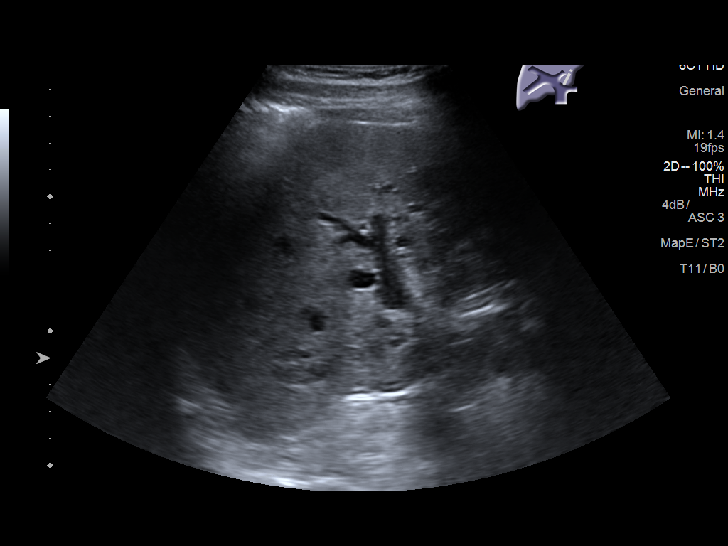
[im 19/35]
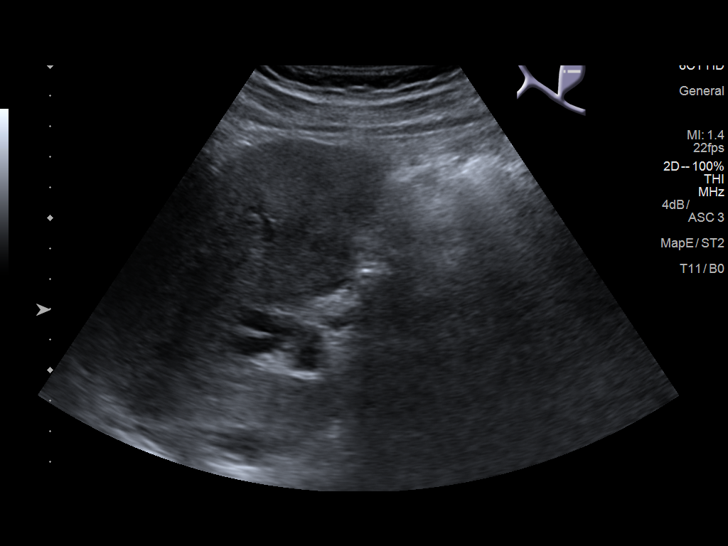
[im 22/35]
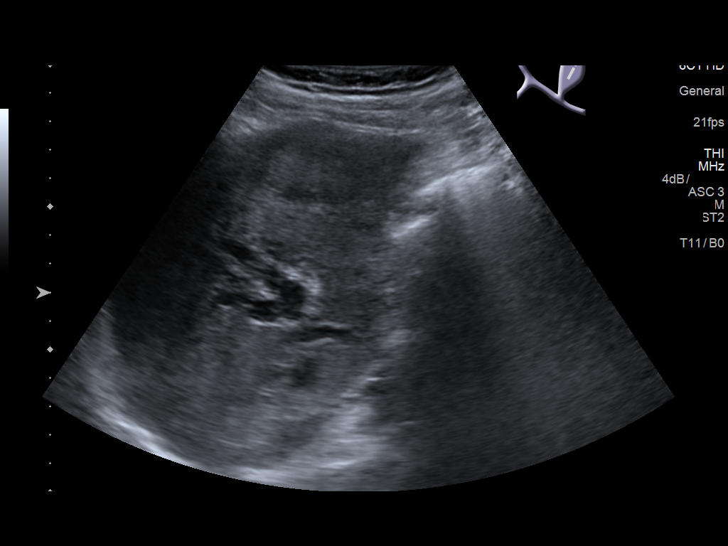
[im 23/35]
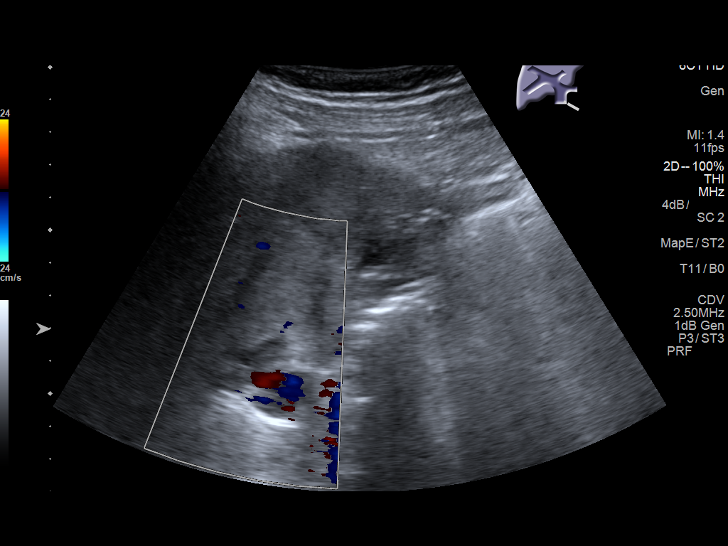
[im 26/35]
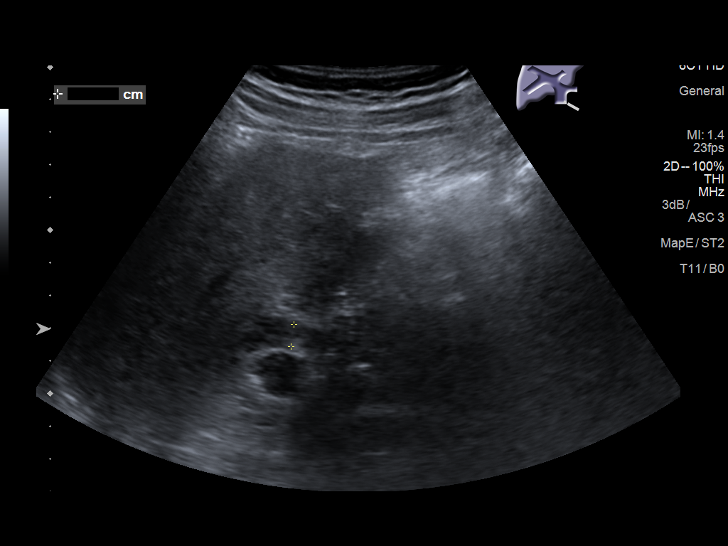
[im 29/35]
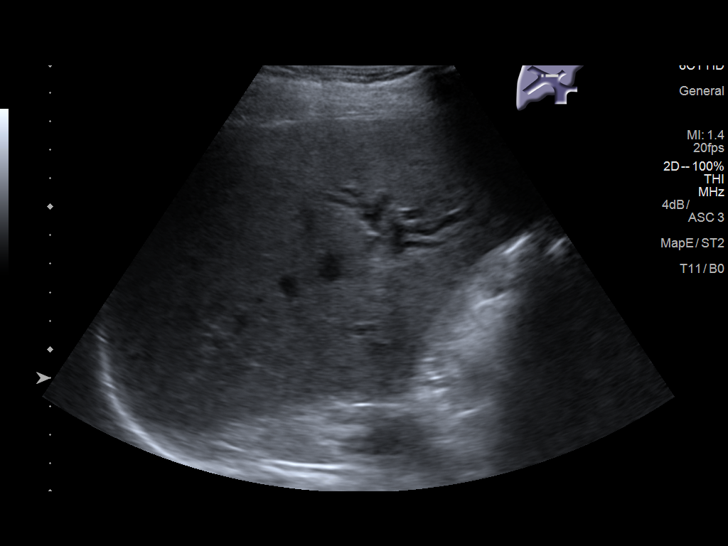
[im 32/35]
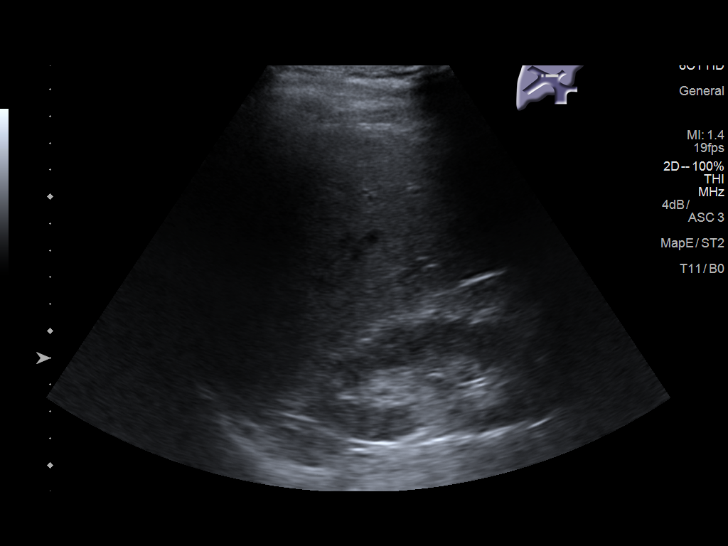
[im 35/35]
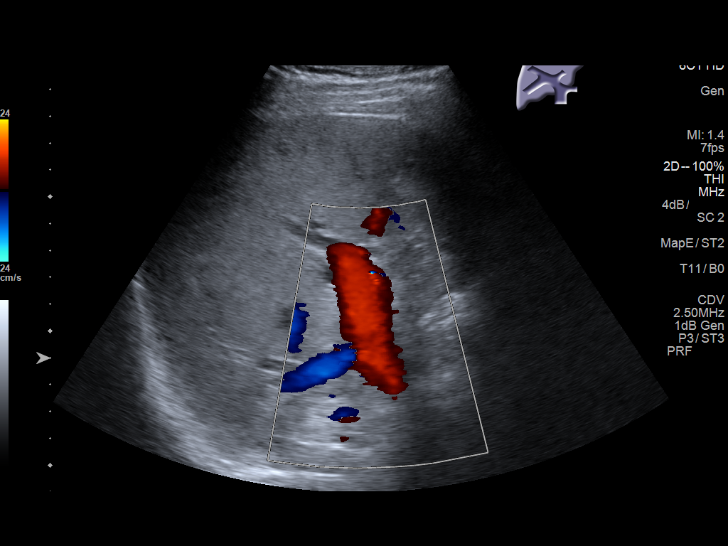

[14 of 25 positions shown; findings below may reference images not displayed]

FINDINGS: Gallbladder:

Not visualized.

Common bile duct:

Diameter: 7 mm.  Not visualized distally due to bowel gas.

Liver:

Coarse heterogeneous echotexture. Apparent nearly isoechoic mass in
the left lobe measuring 4.1 x 3.1 x 4.1 cm. No other convincing
masses. Portal vein is patent on color Doppler imaging with normal
direction of blood flow towards the liver.
IMPRESSION: 1. No acute findings.
2. Gallbladder not visualized. It may be decompressed. No given
history of a cholecystectomy.
3. Abnormal appearance of the liver with a coarsened heterogeneous
echotexture. Apparent solid 4.1 cm mass in the left lobe. Recommend
follow-up liver MRI with and without contrast for further
assessment.
